# Patient Record
Sex: Female | Born: 1935 | Race: White | Hispanic: No | Smoking: Never smoker
Health system: Southern US, Community
[De-identification: ages and names within clinical notes are randomized; demographics above are authoritative.]

## PROBLEM LIST (undated history)

## (undated) DIAGNOSIS — M5481 Occipital neuralgia: Secondary | ICD-10-CM

## (undated) DIAGNOSIS — R51 Headache: Secondary | ICD-10-CM

## (undated) DIAGNOSIS — M1990 Unspecified osteoarthritis, unspecified site: Secondary | ICD-10-CM

## (undated) DIAGNOSIS — I10 Essential (primary) hypertension: Secondary | ICD-10-CM

## (undated) DIAGNOSIS — I4891 Unspecified atrial fibrillation: Secondary | ICD-10-CM

## (undated) DIAGNOSIS — I519 Heart disease, unspecified: Secondary | ICD-10-CM

## (undated) DIAGNOSIS — E785 Hyperlipidemia, unspecified: Secondary | ICD-10-CM

## (undated) DIAGNOSIS — M810 Age-related osteoporosis without current pathological fracture: Secondary | ICD-10-CM

## (undated) DIAGNOSIS — I5022 Chronic systolic (congestive) heart failure: Secondary | ICD-10-CM

## (undated) DIAGNOSIS — M40209 Unspecified kyphosis, site unspecified: Secondary | ICD-10-CM

## (undated) HISTORY — DX: Age-related osteoporosis without current pathological fracture: M81.0

## (undated) HISTORY — DX: Chronic systolic (congestive) heart failure: I50.22

## (undated) HISTORY — PX: CATARACT EXTRACTION: SUR2

## (undated) HISTORY — DX: Essential (primary) hypertension: I10

## (undated) HISTORY — DX: Occipital neuralgia: M54.81

## (undated) HISTORY — DX: Unspecified atrial fibrillation: I48.91

## (undated) HISTORY — DX: Unspecified kyphosis, site unspecified: M40.209

## (undated) HISTORY — DX: Unspecified osteoarthritis, unspecified site: M19.90

## (undated) HISTORY — DX: Heart disease, unspecified: I51.9

## (undated) HISTORY — DX: Hyperlipidemia, unspecified: E78.5

## (undated) HISTORY — DX: Headache: R51

---

## 1998-01-01 ENCOUNTER — Other Ambulatory Visit: Admission: RE | Admit: 1998-01-01 | Discharge: 1998-01-01 | Payer: Self-pay | Admitting: Internal Medicine

## 1999-02-01 ENCOUNTER — Other Ambulatory Visit: Admission: RE | Admit: 1999-02-01 | Discharge: 1999-02-01 | Payer: Self-pay | Admitting: Internal Medicine

## 1999-12-14 ENCOUNTER — Ambulatory Visit (HOSPITAL_COMMUNITY): Admission: RE | Admit: 1999-12-14 | Discharge: 1999-12-14 | Payer: Self-pay | Admitting: Specialist

## 2000-02-20 ENCOUNTER — Other Ambulatory Visit: Admission: RE | Admit: 2000-02-20 | Discharge: 2000-02-20 | Payer: Self-pay | Admitting: Internal Medicine

## 2000-03-07 ENCOUNTER — Ambulatory Visit (HOSPITAL_COMMUNITY): Admission: RE | Admit: 2000-03-07 | Discharge: 2000-03-07 | Payer: Self-pay | Admitting: Specialist

## 2000-04-18 ENCOUNTER — Ambulatory Visit (HOSPITAL_COMMUNITY): Admission: RE | Admit: 2000-04-18 | Discharge: 2000-04-18 | Payer: Self-pay | Admitting: *Deleted

## 2001-02-26 ENCOUNTER — Other Ambulatory Visit: Admission: RE | Admit: 2001-02-26 | Discharge: 2001-02-26 | Payer: Self-pay | Admitting: Internal Medicine

## 2003-02-04 ENCOUNTER — Encounter (INDEPENDENT_AMBULATORY_CARE_PROVIDER_SITE_OTHER): Payer: Self-pay

## 2003-02-04 ENCOUNTER — Ambulatory Visit (HOSPITAL_COMMUNITY): Admission: RE | Admit: 2003-02-04 | Discharge: 2003-02-04 | Payer: Self-pay | Admitting: *Deleted

## 2004-02-22 ENCOUNTER — Encounter (INDEPENDENT_AMBULATORY_CARE_PROVIDER_SITE_OTHER): Payer: Self-pay | Admitting: *Deleted

## 2004-02-22 ENCOUNTER — Ambulatory Visit (HOSPITAL_COMMUNITY): Admission: RE | Admit: 2004-02-22 | Discharge: 2004-02-22 | Payer: Self-pay | Admitting: *Deleted

## 2007-04-15 ENCOUNTER — Inpatient Hospital Stay (HOSPITAL_COMMUNITY): Admission: AD | Admit: 2007-04-15 | Discharge: 2007-04-18 | Payer: Self-pay | Admitting: *Deleted

## 2007-04-15 ENCOUNTER — Encounter: Payer: Self-pay | Admitting: *Deleted

## 2007-04-16 ENCOUNTER — Encounter (INDEPENDENT_AMBULATORY_CARE_PROVIDER_SITE_OTHER): Payer: Self-pay | Admitting: *Deleted

## 2007-04-28 ENCOUNTER — Encounter: Admission: RE | Admit: 2007-04-28 | Discharge: 2007-04-28 | Payer: Self-pay | Admitting: Orthopedic Surgery

## 2007-09-12 ENCOUNTER — Encounter: Admission: RE | Admit: 2007-09-12 | Discharge: 2007-09-12 | Payer: Self-pay | Admitting: Internal Medicine

## 2010-11-15 ENCOUNTER — Encounter: Payer: Self-pay | Admitting: Cardiology

## 2010-11-15 DIAGNOSIS — I519 Heart disease, unspecified: Secondary | ICD-10-CM | POA: Insufficient documentation

## 2010-11-15 DIAGNOSIS — E785 Hyperlipidemia, unspecified: Secondary | ICD-10-CM | POA: Insufficient documentation

## 2010-11-15 DIAGNOSIS — I4891 Unspecified atrial fibrillation: Secondary | ICD-10-CM | POA: Insufficient documentation

## 2010-11-15 DIAGNOSIS — M1990 Unspecified osteoarthritis, unspecified site: Secondary | ICD-10-CM | POA: Insufficient documentation

## 2010-11-15 DIAGNOSIS — I10 Essential (primary) hypertension: Secondary | ICD-10-CM | POA: Insufficient documentation

## 2010-11-22 ENCOUNTER — Encounter: Payer: Self-pay | Admitting: Cardiology

## 2010-11-22 ENCOUNTER — Ambulatory Visit (INDEPENDENT_AMBULATORY_CARE_PROVIDER_SITE_OTHER): Payer: Medicare Other | Admitting: Cardiology

## 2010-11-22 VITALS — BP 130/76 | HR 76 | Ht <= 58 in | Wt 107.1 lb

## 2010-11-22 DIAGNOSIS — I519 Heart disease, unspecified: Secondary | ICD-10-CM

## 2010-11-22 DIAGNOSIS — I10 Essential (primary) hypertension: Secondary | ICD-10-CM

## 2010-11-22 DIAGNOSIS — I4891 Unspecified atrial fibrillation: Secondary | ICD-10-CM

## 2010-11-22 NOTE — Assessment & Plan Note (Signed)
Well-controlled by my exam. We will continue with her current therapy and follow up again in one year.

## 2010-11-22 NOTE — Assessment & Plan Note (Signed)
Mild LV dysfunction by echocardiogram. She is asymptomatic. She is on appropriate therapy.

## 2010-11-22 NOTE — Patient Instructions (Signed)
Continue current medications.  Call us with any cardiac complaints.  I will see you back again in 1 year.

## 2010-11-22 NOTE — Progress Notes (Signed)
Alicia Gomez Date of Birth: 23-May-1926   History of Present Illness: Alicia Gomez is seen for yearly followup. She is done for a well this past year. She denies any complaints of shortness of breath, chest pain, or palpitations. She is basically on the same medications except her Lipitor was switched to Crestor.  Current Outpatient Prescriptions on File Prior to Visit  Medication Sig Dispense Refill  . acetaminophen (TYLENOL) 325 MG tablet Take 650 mg by mouth at bedtime.        Marland Kitchen alendronate (FOSAMAX) 70 MG tablet Take 70 mg by mouth every 7 (seven) days. Take with a full glass of water on an empty stomach.       Marland Kitchen aspirin 81 MG tablet Take 81 mg by mouth daily.        . bimatoprost (LUMIGAN) 0.03 % ophthalmic drops Apply 1 drop to eye at bedtime.        . carvedilol (COREG) 25 MG tablet Take 12.5 mg by mouth 2 (two) times daily with a meal.        . Cholecalciferol (VITAMIN D PO) Take by mouth daily.        . cyclobenzaprine (FLEXERIL) 10 MG tablet Take 10 mg by mouth 2 (two) times daily.        Marland Kitchen escitalopram (LEXAPRO) 10 MG tablet Take 10 mg by mouth daily.        Marland Kitchen gabapentin (NEURONTIN) 100 MG capsule Take 100 mg by mouth daily. 1-2 DAILY       . Multiple Vitamin (MULTIVITAMIN) tablet Take 1 tablet by mouth daily.        Marland Kitchen omeprazole (PRILOSEC OTC) 20 MG tablet Take 20 mg by mouth daily.        . rosuvastatin (CRESTOR) 10 MG tablet Take 10 mg by mouth at bedtime.        Marland Kitchen telmisartan (MICARDIS) 80 MG tablet Take 80 mg by mouth daily.        . timolol (BETIMOL) 0.5 % ophthalmic solution Apply 1 drop to eye 1 dose over 46 hours.        Marland Kitchen DISCONTD: atorvastatin (LIPITOR) 40 MG tablet Take 20 mg by mouth daily.        Marland Kitchen DISCONTD: telmisartan-hydrochlorothiazide (MICARDIS HCT) 40-12.5 MG per tablet Take 1 tablet by mouth daily.          Allergies  Allergen Reactions  . Codeine     Past Medical History  Diagnosis Date  . Left ventricular dysfunction     NOW NORMALIZED  .  Hypertension   . Dyslipidemia   . Osteoarthritis   . Atrial fibrillation     RESOLVED  . Kyphosis     Past Surgical History  Procedure Date  . Cataract extraction     History  Smoking status  . Never Smoker   Smokeless tobacco  . Not on file    History  Alcohol Use No    Family History  Problem Relation Age of Onset  . Heart disease Brother   . Hypertension Other     6 siblings died with heart disease    Review of Systems:   All other systems were reviewed and are negative.  Physical Exam: BP 130/76  Pulse 76  Ht 4' (1.219 m)  Wt 107 lb 2 oz (48.592 kg)  BMI 32.69 kg/m2 She is a pleasant elderly white female in no acute distress. Her HEENT exam is unremarkable. She has no JVD or bruits. Lungs  are clear. Cardiac exam reveals a regular rate and rhythm without gallop, murmur, or click. She has marked kyphosis. She has no edema. Pedal pulses are good. LABORATORY DATA: ECG demonstrates normal sinus rhythm with an interventricular conduction delay and left axis deviation.  Assessment / Plan:

## 2010-11-22 NOTE — Assessment & Plan Note (Signed)
Remote history. No recurrence. We'll continue with baby aspirin daily and beta blocker therapy.

## 2010-12-06 NOTE — Discharge Summary (Signed)
NAMEKEBRINA, FRIEND                 ACCOUNT NO.:  [id number]   MEDICAL RECORD NO.:  [id number]          PATIENT TYPE:  INP   LOCATION:  2011                         FACILITY:  [facility]   PHYSICIAN:  Elmore Guise., M.D.DATE OF BIRTH:  Oct 14, 1925   DATE OF ADMISSION:  04/15/2007  DATE OF DISCHARGE:  04/18/2007                               DISCHARGE SUMMARY   DISCHARGE DIAGNOSES:  1. New-onset atrial fibrillation now in normal sinus rhythm.  2. History of etiopathic dilated cardiomyopathy (EF approximately      25%).  3. Dyslipidemia.  4. Hypertension.  5. Osteoarthritis.   HISTORY OF PRESENT ILLNESS:  Ms. Alicia Gomez is a very pleasant 77 year old  white female who presented initially to [facility] ER for evaluation of  dizziness, palpitations and shortness of breath.  She was found to be in  atrial fibrillation with rapid ventricular response.   HOSPITAL COURSE:  Because of bed issues, she was transferred to [facility] so  that she could have telemetry monitoring.  Her hospital stay was  uncomplicated.  She converted to normal sinus rhythm after starting IV  amiodarone.  She now reports she feels much better.  She has been up and  ambulatory and tolerating normal diet without any problems.  She was  started on Coumadin during her hospital stay with slight increase in her  PT prior to discharge.  Her renal function remained stable as well as  her liver function throughout her hospital stay.  She did have PFTs and  a TSH done prior to starting her amiodarone.   DISCHARGE MEDICATIONS:  1. Amiodarone 200 mg two times daily.  2. Coumadin 2 mg one time daily.  3. Lipitor 40 mg half tablet daily.  4. Micardis HCT 40/12.5 mg one time daily.  5. Coreg 25 mg half tablet twice daily.  6. Lexapro 10 mg daily.  7. Fosamax as before.  8. Calcium as before.   FOLLOWUP:  Her follow-up appointment will be with Dr. Reyes Ivan at  [facility] on Monday September 29 for PT/INR, office visit  and ECG.  She is to call the office if she has any problems prior to  that time.  All her questions were answered prior to discharge.  Her  medications were reviewed with her at length      Elmore Guise., M.D.  Electronically Signed     TWK/MEDQ  D:  04/18/2007  T:  04/18/2007  Job:  [id number]   cc:   Massie Maroon, MD

## 2010-12-06 NOTE — H&P (Signed)
NAME:  Alicia Gomez, Alicia Gomez                 ACCOUNT NO.:  [id number]   MEDICAL RECORD NO.:  [id number]          PATIENT TYPE:  EMS   LOCATION:  ED                           FACILITY:  [facility]   PHYSICIAN:  Elmore Guise., M.D.DATE OF BIRTH:  12-20-1925   DATE OF ADMISSION:  04/15/2007  DATE OF DISCHARGE:                              HISTORY & PHYSICAL   INDICATION FOR ADMISSION:  Atrial fibrillation with rapid ventricular  response.   PRIMARY CARE PHYSICIAN:  Massie Maroon, M.D., [facility].   HISTORY OF PRESENT ILLNESS:  Alicia Gomez is a very pleasant 77 year old  white female with past medical history of CHF, hypertension,  osteoarthritis who presents for evaluation of atrial fibrillation with  rapid ventricular response.  The patient actually reports a 1-week  history of difficulty with her blood pressure and mild dyspnea.  She  states that she has actually been increasing her Micardis dose to help  her get her blood pressure under better control.  On Saturday, she had a  fleeting spell of palpitations with her heart racing and skipping.  This  lasted only for a couple minutes and then went back to normal.  On  Sunday right before church, she had a second spell with her heart  racing, skipping and pounding.  She felt a little lightheaded and short  of breath; however, her spell stopped and she was able to go about her  normal activities without any problems.  Today, she awoke with a heart  rate skipping and pounding.  She got a little short- winded, felt dizzy.  She went to [facility] to see Dr. Selena Batten.  There, she was found to  be in atrial fibrillation with rapid ventricular response.  She was then  sent to the [facility] emergency room for further evaluation.  On arrival  here, the patient was hemodynamically stable but in atrial fibrillation  with rapid ventricular response and a heart rate in the 120-150 range.  She was given Cardizem 10 mg IV bolus x1 and started on a  Cardizem drip  with improvement in her heart rate.  She is currently without complaint.  She does report off and on right knee pain and has had a cortisone  injection in the past.  She has also reported that she has had to  increase her ibuprofen use over the last week because of her knee pain.  She denies any orthopnea or PND.  No presyncope or syncope.  No recent  fever or cough, and no lower extremity edema.   REVIEW OF SYSTEMS:  As per HPI.  All others are negative.   CURRENT MEDICATIONS:  1. Coreg 25 mg twice daily.  2. Micardis 80 mg daily.  3. Ibuprofen p.r.n.  4. Calcium.  5. Fosamax.   ALLERGIES:  CODEINE.   FAMILY HISTORY:  Positive for diabetes and heart disease.   SOCIAL HISTORY:  She lives independently.  She is very active.  She  still mows her yard.  No tobacco or alcohol use.   PHYSICAL EXAMINATION:  VITAL SIGNS:  She is afebrile.  Temperature is  97.8, blood pressure 120/85, heart rate is 125, showing atrial  fibrillation on the monitor.  She is saturating 99% on room air.  GENERAL:  She is a very pleasant, elderly white female alert and  oriented x4 and in no acute distress.  HEENT:  Normal.  NECK:  Supple.  No lymphadenopathy, 2+ carotids.  No JVD and no bruits.  LUNGS:  Clear.  HEART:  Irregularly irregular with 2/6 systolic ejection murmur noted.  ABDOMEN:  Soft, nontender, nondistended.  No rebound or guarding.  EXTREMITIES:  Warm with 2+ pulses and no significant edema.  NEUROLOGIC:  Cranial nerves are intact.   Carotid sinus massage was performed which showed a heart rate decreasing  down to less than 100.   LABORATORY DATA:  White count 12.1, hemoglobin of 13.4, platelet count  401.  She had a BUN of 25 and creatinine of 0.9, potassium level 4.0.  Her LFTs were normal.   Her chest x-ray shows no acute cardiopulmonary disease, and her ECG  shows atrial fibrillation with rapid ventricular response with heart  rate in the 150s with anterior Q waves  noted.  Cannot rule out  anteroseptal MI.  She had nonspecific ST-T wave changes noted.   IMPRESSION:  1. New-onset atrial fibrillation with rapid ventricular response.  2. Hypertension.  3. History of congestive heart failure per patient.   PLAN:  1. The patient will be admitted to telemetry monitoring.  We will      continue Cardizem drip for rate control.  I will also continue her      Coreg and Micardis.  She will have an echocardiogram done to      evaluate for any valvular or systolic heart disease.  We will also      check TSH and serial cardiac enzymes.  For right now, she will be      treated with Lovenox and aspirin.  All of her questions were      answered.  I will also try to obtain some of her previous workup      from Dr. Elmyra Ricks office.      Elmore Guise., M.D.  Electronically Signed     TWK/MEDQ  D:  04/15/2007  T:  04/16/2007  Job:  [id number]   cc:   Massie Maroon, MD  Fax: [phone]

## 2010-12-08 ENCOUNTER — Emergency Department (HOSPITAL_COMMUNITY): Payer: Medicare Other

## 2010-12-08 ENCOUNTER — Observation Stay (HOSPITAL_COMMUNITY)
Admission: EM | Admit: 2010-12-08 | Discharge: 2010-12-10 | Disposition: A | Discharge status: Home / Self Care | Payer: Medicare Other | Source: Ambulatory Visit | Attending: Internal Medicine | Admitting: Internal Medicine

## 2010-12-08 DIAGNOSIS — R42 Dizziness and giddiness: Secondary | ICD-10-CM | POA: Insufficient documentation

## 2010-12-08 DIAGNOSIS — I671 Cerebral aneurysm, nonruptured: Secondary | ICD-10-CM | POA: Insufficient documentation

## 2010-12-08 DIAGNOSIS — Z7902 Long term (current) use of antithrombotics/antiplatelets: Secondary | ICD-10-CM | POA: Insufficient documentation

## 2010-12-08 DIAGNOSIS — H269 Unspecified cataract: Secondary | ICD-10-CM | POA: Insufficient documentation

## 2010-12-08 DIAGNOSIS — K219 Gastro-esophageal reflux disease without esophagitis: Secondary | ICD-10-CM | POA: Insufficient documentation

## 2010-12-08 DIAGNOSIS — R279 Unspecified lack of coordination: Secondary | ICD-10-CM | POA: Insufficient documentation

## 2010-12-08 DIAGNOSIS — E785 Hyperlipidemia, unspecified: Secondary | ICD-10-CM | POA: Insufficient documentation

## 2010-12-08 DIAGNOSIS — E860 Dehydration: Secondary | ICD-10-CM | POA: Insufficient documentation

## 2010-12-08 DIAGNOSIS — R5381 Other malaise: Principal | ICD-10-CM | POA: Insufficient documentation

## 2010-12-08 DIAGNOSIS — Z79899 Other long term (current) drug therapy: Secondary | ICD-10-CM | POA: Insufficient documentation

## 2010-12-08 DIAGNOSIS — G589 Mononeuropathy, unspecified: Secondary | ICD-10-CM | POA: Insufficient documentation

## 2010-12-08 DIAGNOSIS — I10 Essential (primary) hypertension: Secondary | ICD-10-CM | POA: Insufficient documentation

## 2010-12-08 DIAGNOSIS — M899 Disorder of bone, unspecified: Secondary | ICD-10-CM | POA: Insufficient documentation

## 2010-12-08 LAB — URINALYSIS, ROUTINE W REFLEX MICROSCOPIC
Glucose, UA: NEGATIVE mg/dL
Hgb urine dipstick: NEGATIVE
Ketones, ur: 15 mg/dL — AB
Protein, ur: NEGATIVE mg/dL
Specific Gravity, Urine: 1.017 (ref 1.005–1.030)
pH: 6 (ref 5.0–8.0)

## 2010-12-08 LAB — CBC
Hemoglobin: 11.6 g/dL — ABNORMAL LOW (ref 12.0–15.0)
MCHC: 33.7 g/dL (ref 30.0–36.0)
WBC: 6.8 10*3/uL (ref 4.0–10.5)

## 2010-12-08 LAB — POCT CARDIAC MARKERS: Myoglobin, poc: 492 ng/mL (ref 12–200)

## 2010-12-08 LAB — DIFFERENTIAL
Basophils Relative: 0 % (ref 0–1)
Eosinophils Relative: 1 % (ref 0–5)
Lymphocytes Relative: 13 % (ref 12–46)
Lymphs Abs: 0.9 10*3/uL (ref 0.7–4.0)
Monocytes Relative: 2 % — ABNORMAL LOW (ref 3–12)
Neutro Abs: 5.8 10*3/uL (ref 1.7–7.7)

## 2010-12-08 LAB — COMPREHENSIVE METABOLIC PANEL
AST: 21 U/L (ref 0–37)
Albumin: 3.9 g/dL (ref 3.5–5.2)
CO2: 28 mEq/L (ref 19–32)
GFR calc non Af Amer: >60 mL/min (ref >60)
Glucose, Bld: 137 mg/dL — ABNORMAL HIGH (ref 70–99)
Total Bilirubin: 0.4 mg/dL (ref 0.3–1.2)
Total Protein: 6.9 g/dL (ref 6.0–8.3)

## 2010-12-08 LAB — GLUCOSE, CAPILLARY: Glucose-Capillary: 117 mg/dL — ABNORMAL HIGH (ref 70–99)

## 2010-12-08 MED ORDER — IOHEXOL 350 MG/ML SOLN
50.0000 mL | Freq: Once | INTRAVENOUS | Status: AC | PRN
  Administered 2010-12-08: 50 mL via INTRAVENOUS

## 2010-12-09 ENCOUNTER — Observation Stay (HOSPITAL_COMMUNITY): Payer: Medicare Other

## 2010-12-09 NOTE — Op Note (Signed)
[place]. [facility]  Patient:    Alicia Gomez, Alicia Gomez                        MRN: [id number] Proc. Date: 12/14/99 Adm. Date:  [id number] Disc. Date: [id number] Attending:  Mick Sell                           Operative Report  PREOPERATIVE DIAGNOSIS:  Cataract, left eye.  POSTOPERATIVE DIAGNOSIS:  Cataract, left eye.  OPERATION PERFORMED:  Cataract extraction with intraocular lens implant, OS.  SURGEON:  Chucky May, M.D.  ANESTHESIA:  INDICATIONS FOR PROCEDURE:  The patient is a 77 year old female with painless progressive decrease in vision so she has difficulty seeing for reading.  On examination, she was found to have a dense nuclear sclerotic and cortical cataract consistent with a decrease in visual acuity.  DESCRIPTION OF PROCEDURE:  The patient was brought to the main operating room and placed in the supine position.  Anesthesia was obtained by means of topical 4% lidocaine drops with tetracaine.  She was then prepped and draped in the usual manner.  A lid speculum was inserted and the cornea was entered with a diamond keratome superiorly with an additional port superior temporally.  Occucoat was instilled and an anterior capsulorrhexis was performed without difficulty.  The nucleus was mobilized by hydrodissection followed by phacoemulsification of the nucleus and removal of residual cortical material by irrigation and aspiration.  The posterior capsule was polished and the posterior chamber lens implant was placed in the bag without difficulty.  Occucoat was removed and replaced with balanced salt solution. The wound was hydrated with balanced salt solution, checked for fluid leaks and none were noted.  The eye was dressed with topical Pred-Forte, Ocuflox, Voltaren, and a Fox shield.  The patient was taken to the recovery room in excellent condition where she received written and verbal instructions for postoperative care, and  was scheduled for a follow up in 24 hours. DD:  01/18/00 TD:  01/19/00 Job: [id number]

## 2010-12-09 NOTE — Op Note (Signed)
   NAME:  Alicia Gomez, Alicia Gomez                           ACCOUNT NO.:  [id number]   MEDICAL RECORD NO.:  [id number]                   PATIENT TYPE:  AMB   LOCATION:  ENDO                                 [facility]ACILITY:  [facility]   PHYSICIAN:  Georgiana Spinner, M.D.                 DATE OF BIRTH:  [birth year]   DATE OF PROCEDURE:  02/04/2003  DATE OF DISCHARGE:                                 OPERATIVE REPORT   PROCEDURE:  Colonoscopy.   INDICATIONS:  Colon polyps.   ANESTHESIA:  1. Demerol 10 mg.  2. Versed 1 mg.   DESCRIPTION OF PROCEDURE:  With the patient mildly sedated in the left  lateral decubitus position, the Olympus videoscopic variable-stiffness  colonoscope was inserted in the rectum and passed under direct vision  through a tortuous diverticula-filled sigmoid colon to the cecum, identified  by the ileocecal valve and appendiceal orifice, both of which were  photographed.  From this point, the colonoscope was slowly withdrawn, taking  circumferential views of the colonic mucosa, stopping only to photograph  diverticula seen along the way in the sigmoid colon until we reached the  rectum which appeared normal on direct and retroflexed view.  The endoscope  was straightened and withdrawn.  The patient's vital signs and pulse  oximeter remained stable.  The patient tolerated the procedure well without  apparent complications.   FINDINGS:  1. Significant diverticulosis of the sigmoid colon.  2. Otherwise, an unremarkable exam.   PLAN:  Repeat examination possibly in five years.  See endoscopy note for  further details.                                               Georgiana Spinner, M.D.    GMO/MEDQ  D:  02/04/2003  T:  02/04/2003  Job:  [id number]

## 2010-12-09 NOTE — Op Note (Signed)
[place]. [facility]  Patient:    Alicia Gomez, Alicia Gomez                        MRN: [id number] Proc. Date: 03/05/00 Adm. Date:  [id number] Disc. Date: [id number] Attending:  Mick Sell                           Operative Report  PREOPERATIVE DIAGNOSIS:  Cataract, OD  POSTOPERATIVE DIAGNOSIS:  Cataract, OD  OPERATION PERFORMED:  Cataract extraction with intraocular lens implant, OD.  INDICATIONS:  The patient is a 77 year old female with painless progressive decrease in vision associated with difficulty seeing for reading.  On examination, she was found to have a dense nucleosclerotic and cortical cataract consistent with decrease in visual acuity.  PROCEDURE IN DETAIL:  The patient was brought to the main operating room, placed in supine position.  Anesthesia was obtained by means of topical 4% lidocaine drops with tetracaine.  She was then prepped and draped in the usual manner.  A lid speculum was inserted and the cornea was entered with a diamond keratome superiorly with an additional port superotemorally.  OcuCoat was instilled and an anterior capsulorrhexis was performed.  The nucleus was mobilized, hydrodissected and phacoemulsified, with residual cortical material removed by irrigation and aspiration.  The posterior capsule was polished and a posterior chamber lens implant was placed in the bag without difficulty. OcuCoat was removed and replaced with balanced salt solution. The wound was hydrated with balanced salt solution and checked for fluid leaks and none were noted.  The eye was dressed with topical Pred Forte, Ocuflox, Voltaren, and a Fox shield and the patient was taken to the recovery room in excellent condition, where she received written and verbal instructions and was scheduled for followup in 24 hours. DD:  04/05/00 TD:  04/07/00 Job: [id number]

## 2010-12-09 NOTE — Procedures (Signed)
[place]. [facility]  Patient:    Alicia Gomez, Alicia Gomez                        MRN: [id number] Proc. Date: 04/18/00 Adm. Date:  [id number] Attending:  Sabino Gasser                           Procedure Report  PROCEDURE:  Colonoscopy.  INDICATIONS:  Hemoccult positivity.  ANESTHESIA:  Demerol 25 mg, Versed 2 mg.  PROCEDURE:  With the patient mildly sedated and in the left lateral decubitus position, the Olympus videoscopic variable-stiffness colonoscope was inserted in the rectum and passed under direct vision to the cecum.  Cecum identified by the ileocecal valve and appendiceal orifice, both of which were photographed.  From this point, the colonoscope was slowly withdrawn, taking circumferential views of the entire colonic mucosa, stopping only in the sigmoid colon, where significant diverticulosis was seen and a polyp was seen at approximately 20 cm.  This was photographed and using snare cautery technique, setting of 20/20 blended current, it was removed.  The endoscope was then withdrawn to the rectum, which appeared normal on direct view and showed essentially normal on retroflexed view.  The endoscope was straightened and withdrawn.  Patients vital signs and pulse oximeter remained stable.  The patient tolerated the procedure well without apparent complications.  FINDINGS:  Significant diverticulosis involving all of the sigmoid colon and a good part of the left colon.  Additionally, polyp at 20 cm.  Await biopsy report.  Patient will call me for results and follow up with me as an outpatient. DD:  04/18/00 TD:  04/18/00 Job: [id number]

## 2010-12-09 NOTE — Op Note (Signed)
NAME:  Alicia Gomez, Alicia Gomez                           ACCOUNT NO.:  [id number]   MEDICAL RECORD NO.:  [id number]                   PATIENT TYPE:  AMB   LOCATION:  ENDO                                 [facility]ACILITY[facility]  [facility]   PHYSICIAN:  Georgiana Spinner, M.D.                 DATE OF BIRTH:  September 12, 1925   DATE OF PROCEDURE:  02/22/2004  DATE OF DISCHARGE:                                 OPERATIVE REPORT   PROCEDURE:  Upper endoscopy.   INDICATIONS:  GERD.   ANESTHESIA:  Demerol 20 mg, Versed 3 mg.   PROCEDURE:  With the patient mildly sedated in the left lateral decubitus  position, the Olympus videoscopic endoscope was inserted in the mouth,  passed under direct vision through the esophagus, which was photographed.  In the distal area there was a question of a questionable flame of  Barrett's, photographed.  We entered into the stomach.  Fundus, body,  antrum, duodenal bulb, second portion of the duodenum were visualized.  All  appeared normal.  From this point the endoscope was slowly withdrawn, taking  circumferential views of the duodenal mucosa until the endoscope had been  pulled back into the stomach, placed in retroflexion to view the stomach  from below, and a large hiatal hernia was seen and photographed.  The  endoscope was then straightened and withdrawn taking circumferential views  of the remaining gastric and esophageal mucosa, stopping in the distal  esophagus to biopsy this questionable flame of Barrett's.  The patient's  vital signs and pulse oximetry remained stable.  The patient tolerated the  procedure well without apparent complication.   FINDINGS:  Question of Barrett's esophagus, biopsied.   Await biopsy report.  The patient will call me for results and follow up  with me as an outpatient.                                               Georgiana Spinner, M.D.    GMO/MEDQ  D:  02/22/2004  T:  02/22/2004  Job:  [id number]

## 2010-12-09 NOTE — Procedures (Signed)
[place]. [facility]  Patient:    Alicia Gomez, Alicia Gomez                        MRN: [id number] Proc. Date: 04/18/00 Adm. Date:  [id number] Attending:  Sabino Gasser                           Procedure Report  PROCEDURE:  Upper endoscopy with biopsy.  INDICATIONS:  Hemoccult positivity.  ANESTHESIA:  Demerol 50 mg, Versed 3 mg given intravenously in divided dose.  PROCEDURE:  With the patient mildly sedated in the left lateral decubitus position, the Olympus videoscopic endoscope was inserted in the mouth and passed under direct vision through the esophagus.  Distal esophagus was approached and showed some changes of esophagitis and possible stricture above a hiatal hernia photographed and biopsied.  We entered into the stomach. Fundus, body and antrum were well-visualized.  In the fundus, there were ulcerations, linear excoriations that were photographed and biopsied.  These may be NSAID-induced.  We will await biopsy.  We entered into the duodenal bulb and second portion of duodenum, both of which appeared normal and were photographed.  From this point, the endoscope was slowly withdrawn, taking circumferential views of the entire duodenal mucosa until the endoscope had been pulled back into the stomach, placed on retroflexion to view the stomach from below and this showed the large hiatal hernia.  The endoscope was straightened and pulled back from distal to proximal stomach taking circumferential views of the entire gastric and subsequently the esophageal mucosa, which appeared, other than above, normal.  Patients vital signs and pulse oximeter remained stable.  The patient tolerated the procedure well without apparent complications.  FINDINGS:  Ulcerations throughout fundus of stomach, possibly NSAID-related. Await biopsy.  Erythema of distal esophagus with possible stricture formation above a hiatal hernia, again await biopsy.  Patient will call me for  results and follow up with me as an outpatient.  Proceed to colonoscopy. DD:  04/18/00 TD:  04/18/00 Job: [id number]

## 2010-12-09 NOTE — Op Note (Signed)
   NAME:  Alicia Gomez, Alicia Gomez                           ACCOUNT NO.:  [id number]   MEDICAL RECORD NO.:  [id number]                   PATIENT TYPE:  AMB   LOCATION:  ENDO                                 FACILITY:  [facility]   PHYSICIAN:  Georgiana Spinner, M.D.                 DATE OF BIRTH:  [birth year]   DATE OF PROCEDURE:  02/04/2003  DATE OF DISCHARGE:                                 OPERATIVE REPORT   PROCEDURE:  Upper endoscopy.   INDICATIONS:  GERD with history of Barrett's esophagus.   ANESTHESIA:  Demerol 40 mg, Versed 3 mg.   PROCEDURE:  With the patient mildly sedated in the left lateral decubitus  position, the Olympus videoscopic endoscope was inserted in the mouth,  passed under direct vision through the esophagus, which appeared grossly  normal.  We entered into the stomach through a very large hiatal hernia.  Fundus, body, antrum, duodenal bulb, second portion of the duodenum appeared  normal.  From this point the endoscope was slowly withdrawn, taking  circumferential views of the duodenal mucosa until the endoscope was pulled  back into the stomach, placed in retroflexion to view the stomach from  below, and once again the large hiatal hernia was photographed.  The  endoscope was straightened and withdrawn, taking circumferential views of  the remaining gastric and esophageal mucosa, stopping in the distal  esophagus, where there was a change possibly of Barrett's esophagus, where a  pseudodiverticulum was noted.  Biopsies were taken.  The patient's vital  signs and pulse oximetry remained stable.  The patient tolerated the  procedure well and without apparent complications.   FINDINGS:  1. Large hiatal hernia.  2. Question Barrett's esophagus, biopsied.   Await biopsy report.  Have the patient call me for results and follow up  with me as an outpatient.  Proceed to colonoscopy as planned.                                                Georgiana Spinner, M.D.    GMO/MEDQ   D:  02/04/2003  T:  02/04/2003  Job:  [id number]

## 2010-12-22 NOTE — Discharge Summary (Signed)
NAME:  Alicia Gomez, Alicia Gomez                 ACCOUNT NO.:  [id number]  MEDICAL RECORD NO.:  [id number]           PATIENT TYPE:  O  LOCATION:  3027                         FACILITY:  [facility]  PHYSICIAN:  Altha Harm, MDDATE OF BIRTH:  1925-10-24  DATE OF ADMISSION:  12/08/2010 DATE OF DISCHARGE:  12/10/2010                              DISCHARGE SUMMARY   DISCHARGE DISPOSITION:  Home with around-the-clock supervision.  FINAL DISCHARGE DIAGNOSES: 1. Generalized weakness improved. 2. Mild dehydration. 3. Chronic intermittent dizziness baseline. 4. Cerebral aneurysm.  SECONDARY DIAGNOSES: 1. Hypertension. 2. Hyperlipidemia. 3. Osteoporosis. 4. Cataracts. 5. Gastroesophageal reflux disease. 6. Hyperlipidemia. 7. Intermittent spasms and neuropathy.  FINAL DISCHARGE MEDICATIONS: 1. Meclizine 12.5 mg p.o. q.8 h. 2. Alendronate 70 mg p.o. weekly on Tuesday. 3. Aspirin 81 mg p.o. daily. 4. Carvedilol 12.5 mg p.o. b.i.d. 5. Crestor 10 mg p.o. at bedtime. 6. Flexeril 10 mg p.o. b.i.d. 7. Gabapentin 100 mg p.o. b.i.d. p.r.n. pain. 8. Lexapro 10 mg p.o. daily. 9. Lumigan 1 drop to both eyes daily at bedtime. 10.Micardis 40 mg p.o. daily. 11.Prilosec over-the-counter 1 capsule p.o. daily. 12.Timolol ophthalmic solution 1 drop to each eye every morning. 13.Vitamin C over-the-counter 1 tablet p.o. daily.  CONSULTS:  Verbal consultation with Dr. Hoy Morn.  PROCEDURES:  None.  DIAGNOSTIC STUDIES: 1. CT of the head without contrast which shows a possible anterior     communicating aneurysm.  Intracranial CT will be obtained for     further evaluation. 2. No acute intracranial abnormality. 3. CT angiogram which shows anterior communicating artery aneurysm     bilobed.  Focal narrowing of the proximal left P1 segments and     proximal left superior cerebellar artery.  PRIMARY CARE PHYSICIAN:  Massie Maroon, MD  CODE STATUS:  Do not resuscitate.  ALLERGIES:  CODEINE.  CHIEF  COMPLAINT:  Weakness.  HISTORY OF PRESENT ILLNESS:  Please refer to the H&P for details of the history of present illness, however, in short this is a 77 year old elderly female who is frail at baseline has chronic intermittent dizziness and insists on being independent at community level albeit on safely.  The patient apparently had a very exhausting day prior to presentation and was unable to get up in the morning.  The patient had very little oral intake and had a very dry throat.  She presented to emergency room with complaint of weakness in addition to inability to speak.  HOSPITAL COURSE: 1. Generalized weakness.  The patient upon my evaluation in the     emergency room had no focal deficits.  It was cleared clear that     this is a patient who has progressed in her physical and     physiological senility and probably had over done it too much.  The     patient does live by herself and there were some questions to     whether or not this patient would be able to return to her home     setting.  I had a long discussion with the patient's sister-in-law     and both of whom are  her next of kin and healthcare power of     attorney.  The patient herself did not want to undergo any invasive     testing.  She showed no focality of symptoms and did not want to     undergo an MRI with which I agreed.  The patient was evaluated by     physical therapy and occupational therapy for functional safety.     The recommendation is that the patient has home health physical     therapy and occupational therapy and that she have a rolling walker     with 5-inch wheels.  The patient has been resistant to using a     walker.  She has been resistant to stopping driving and has been     resistant to a lifeline.  During this hospitalization, the patient     was counseled on the importance of a lifeline and to restrain from     any further driving as she is at risk felt to herself and others.     The  patient recovered her voice after hydrating herself.  She was     seen by physical therapy and occupational therapy.  There is a     belief that the patient may have some labyrinthine inner ear     dysfunction causing her to have a dizziness, however, given the     patient's anatomy in her neck, she is a poor candidate for a Dix-     Hallpike maneuver.  The patient had no orthostasis and has been     given some meclizine to help with dizziness.  At baseline the     patient has compromised sitting balance and does again she is     unsafe to perform any activities where she has to maintain strength     ability by using peripheral muscles.  I have discussed this at     length with the patient, her nephew, and her sister-in-law.  The     patient is going to go home with home health services and she will     have around-the-clock care.  Otherwise the patient has no acute     issues.  At the time of discharge, the patient is stable.  DISCHARGE PHYSICAL EXAMINATION:  VITAL SIGNS:  Temperature is 97.6, heart rate 75, blood pressure 158/85, respiratory rate 12, O2 sats are 96% on room air.  Her orthostatics were negative. HEENT:  She is normocephalic, atraumatic.  Pupils are equally round and reactive to light and accommodation.  Extraocular movements are intact. Oropharynx is moist.  No exudates, erythema, or lesions are noted. NECK:  Trachea is midline.  The patient does have a decreased cervical curve in the cervical spine.  She has no masses, no thyromegaly, no JVD, no carotid bruit. RESPIRATORY:  The patient has markedly kyphotic posture.  She has reversed PA and lateral excursion due to her anatomy.  However, she has no accessory muscle use.  There is no wheezing, rhonchi, or rales noted. CARDIOVASCULAR:  She has got a normal S1 and S2.  She has got a mild intermittent may be 1/6 systolic ejection murmur which is consistent with likely sclerosis.  It is nonradiating and coming from the  base. ABDOMEN:  Obese, soft, nontender, nondistended.  No masses, no hepatosplenomegaly is noted. EXTREMITIES:  No clubbing, cyanosis, or edema. PSYCHIATRIC:  She is alert and oriented x3.  Good insight and cognition. Good recent and remote recall. NEUROLOGICAL:  The patient shows no focal neurological deficit.  She does have generalized weakness and decreased balance at baseline and she is at her baseline at this time.  DIETARY RESTRICTIONS:  This patient does not eat very much and thus I would not restrict her diet on any level, allow her to eat anything that she wants to that enhances her caloric intake.  PHYSICAL RESTRICTIONS:  The patient should be using a rolling walker under the care of physical therapy.  Total time for this discharge process including face-to-face time is 35 minutes.     Altha Harm, MD     MAM/MEDQ  D:  12/10/2010  T:  12/10/2010  Job:  [id number]  cc:   Massie Maroon, MD  Electronically Signed by Marthann Schiller MD on 12/22/2010 09:27:49 AM

## 2010-12-22 NOTE — H&P (Signed)
NAME:  Alicia, Gomez                 ACCOUNT NO.:  [id number]  MEDICAL RECORD NO.:  [id number]           PATIENT TYPE:  O  LOCATION:  3027                         FACILITY:  [facility]  PHYSICIAN:  Altha Harm, MDDATE OF BIRTH:  06-01-1926  DATE OF ADMISSION:  12/08/2010 DATE OF DISCHARGE:                             HISTORY & PHYSICAL   CHIEF COMPLAINT:  Weakness.  HISTORY OF PRESENT ILLNESS:  Ms. Alicia Gomez is an 77 year old elderly female who awoke this morning feeling weak.  The patient's nephew reports that she had a hair appointment that she was scheduled to go to.  When she did not arrive, they called the nephew who went to her home and found her in bed.  The patient states that she felt weak.  She never lost consciousness.  She never had any falls or anything like that. Apparently, this patient inappropriately still driving and using a riding mower.  She has at baseline very poor balance and ambulates in a full trunk flexion position.  The patient states that on the day before her onset of symptoms, she has been pretty active, cocaine and being out on her riding mower.  The patient states that she has not had any fluids or anything to drink all day.  She denies any fever or chills.  She denies any nausea, vomiting or diarrhea.  The only other symptoms that she admits to is some dizziness, which apparently is not a new symptom and has been occurring intermittently for more than 2 years.  The patient upon presentation to the hospital was very inaudible and had a very low voice almost as though she has some vocal cord paralysis. Thus the ER physician became concerned who felt the patient needed to be admitted.  Upon my arrival to see the patient, I could hear some audible vocal production.  I have been giving the patient something to drink, her voice improved considerably and the patient became overtly audible to everyone in the room.  PAST MEDICAL HISTORY:  Significant  for; 1. Hypertension. 2. Hyperlipidemia. 3. Osteoporosis.  FAMILY HISTORY:  The patient denies any chronic illnesses in her family. She is unmarried and has no children.  SOCIAL HISTORY:  The patient lives alone.  She is, as noted above, usually independent of community level, which likely places the patient at risk to life and limb.  There is no tobacco or alcohol use.  She has her sister-in-law who is at the bedside and her nephew.  The sister-in- law believes that she may be the healthcare power of attorney, but we will check her papers today.  CURRENT MEDICATIONS:  Include the following; 1. Alendronate 70 mg p.o. weekly. 2. Aspirin 81 mg p.o. daily. 3. Coreg 12.5 mg p.o. b.i.d. 4. Crestor 10 mg p.o. at bedtime. 5. Cyclobenzaprine 10 mg p.o. b.i.d. 6. Gabapentin 100 mg p.o. b.i.d. p.r.n. 7. Lexapro 10 mg p.o. daily. 8. Lumigan eye drops 1 drop to both eyes at bedtime. 9. Micardis 40 mg p.o. daily. 10.Prilosec over-the-counter 1 tablet p.o. daily. 11.Timolol ophthalmic solution 1 drop to both eyes daily. 12.Vitamin B over-the-counter 1 cap  p.o. daily.  ALLERGIES:  CODEINE.  PRIMARY CARE PHYSICIAN:  Dr. Pearson Grippe.  REVIEW OF SYSTEMS:  All other systems negative.  DIAGNOSTIC STUDIES:  In the emergency room, she had a 12-lead EKG which shows an old left bundle branch block and otherwise sinus rhythm. Hemogram shows a white blood cell count of 6.8, hemoglobin of 11.6, hematocrit of 34.4, platelet count of 248.  Electrolytes; sodium 137, potassium 4.0, chloride 99, bicarb 28, BUN 19, creatinine 0.64.  CT of the head shows an anterior communicating aneurysm.  CT angiogram confirms the bilobar aneurysm in the anterior communicating artery and there is a question of focal narrowing at the proximal left P1 segment to the proximal left superior cerebellar artery.  PHYSICAL EXAMINATION:  GENERAL:  This is an elderly, frail-appearing lady who is lying in bed in no acute  distress. VITAL SIGNS:  Temperature is 97.4, heart rate 63, blood pressure 147/67, respiratory rate 18 and her O2 saturations are 98% on room air. HEENT:  She is normocephalic, atraumatic.  Pupils are equally round and reactive to light.  Oropharynx is moist.  No exudate, erythema or lesions are noted. NECK:  The patient has a reverse cervical curve.  Her trachea is midline.  There is no masses, no thyromegaly, no JVD or carotid bruit. RESPIRATORY:  The patient has a markedly kyphotic posture.  She has reversed excursion with AP greater than lateral excursion.  There is no wheezing or rhonchi noted.  She has got no visible accessory muscle use. CARDIOVASCULAR:  She has got a normal S1 and S2.  No murmurs, rubs or gallops noted.  PMI is nondisplaced.  No heaves or thrills on palpation. ABDOMEN:  Scaphoid, soft, nontender, nondistended.  No masses.  No hepatosplenomegaly is noted. LYMPH NODE:  She has got no cervical, axillary, or inguinal lymphadenopathy noted. NEUROLOGIC:  The patient appears to have no focal neurological deficits. Cranial nerves II through XII are grossly intact.  DTRs are 2+ in the bilateral upper and lower extremities.  Strength is about 3+ to 4- out of 5 in bilateral upper and lower extremities.  The patient requires assistance with transitional moving and her sitting balance is moderate at the best.  Many challenges causes the patient to be off balance which the family reports is a baseline for this patient. PSYCHIATRIC:  She is alert and oriented x3 with good cognition, questionable insight.  ASSESSMENT AND PLAN:  This is a patient who presents with generalized weakness.  I suspect that this patient is having no new inciting events causes her to have weakness; however, I believe that this is just progression of her senile issues.  I suspect that the patient has exerted herself and is taking longer to recover.  It is possible that the patient may have sustained a  small CVA.  I have spoken at length with the family about further testing and they do not want to pursue an MRI.  They essentially wanted the patient to be evaluated for her function and determinations made about what would provide her with the safest environment.  We will bring the patient in an observation basis and observe her for any decline over the next 12 to 24 hours.  I will ask Physical Therapy and Occupational Therapy to see the patient for evaluation.  Monitor blood pressures to make sure that she is maintaining her blood pressures and likely getting nutrition consult to ensure that she is eating as well as possible.  This patient certainly with  her balance issues poses a risk to herself and other people if she is driving.  She will likely require assisted devices to ambulate; however, her family reports that she is very resistant to using any assistive device.  I have already apprised the family that she will likely require around the clock here or at least a lifeline to which she has been resistant in the past.  Her home medications will be resume and the patient will be observed.  She will receive DVT prophylaxis such as Lovenox and further decisions about care will be based on initial response to therapy and clinical course.     Altha Harm, MD     MAM/MEDQ  D:  12/09/2010  T:  12/09/2010  Job:  [id number]  cc:   Massie Maroon, MD  Electronically Signed by Marthann Schiller MD on 12/22/2010 09:27:39 AM

## 2011-05-04 LAB — BASIC METABOLIC PANEL
Calcium: 9.3
Chloride: 103
Creatinine, Ser: 0.94
GFR calc Af Amer: 58 — ABNORMAL LOW
GFR calc Af Amer: >60
GFR calc non Af Amer: 48 — ABNORMAL LOW
Sodium: 135

## 2011-05-04 LAB — APTT
aPTT: 31
aPTT: 34
aPTT: 23 — ABNORMAL LOW

## 2011-05-04 LAB — COMPREHENSIVE METABOLIC PANEL
ALT: 16
Alkaline Phosphatase: 28 — ABNORMAL LOW
BUN: 25 — ABNORMAL HIGH
Calcium: 9.6
Creatinine, Ser: 0.97
Glucose, Bld: 111 — ABNORMAL HIGH
Glucose, Bld: 126 — ABNORMAL HIGH
Potassium: 4.6
Sodium: 138
Sodium: 143
Total Protein: 5.8 — ABNORMAL LOW
Total Protein: 6.5

## 2011-05-04 LAB — PROTIME-INR
INR: 1
INR: 1.1
Prothrombin Time: 14.6
Prothrombin Time: 14.6

## 2011-05-04 LAB — CBC
HCT: 38.6
HCT: 38.5
Hemoglobin: 13
Hemoglobin: 13.1
Hemoglobin: 14.3
Hemoglobin: 13.4
MCHC: 34.7
RBC: 4.16
RBC: 4.2
RBC: 4.61
RDW: 12.3
RDW: 12.7
RDW: 12.8
WBC: 10.3
WBC: 10.3

## 2011-05-04 LAB — CARDIAC PANEL(CRET KIN+CKTOT+MB+TROPI)
CK, MB: 2.8
CK, MB: 3.7
Relative Index: 2.6 — ABNORMAL HIGH
Relative Index: INVALID
Total CK: 50
Troponin I: 0.04

## 2011-05-04 LAB — DIFFERENTIAL
Lymphocytes Relative: 9 — ABNORMAL LOW
Lymphs Abs: 1.1
Monocytes Relative: 8
Neutro Abs: 10 — ABNORMAL HIGH
Neutrophils Relative %: 83 — ABNORMAL HIGH

## 2011-05-04 LAB — CK TOTAL AND CKMB (NOT AT ARMC)
CK, MB: 4.1 — ABNORMAL HIGH
Total CK: 77

## 2011-05-04 LAB — TSH: TSH: 1.29

## 2011-08-28 DIAGNOSIS — H4011X0 Primary open-angle glaucoma, stage unspecified: Secondary | ICD-10-CM | POA: Diagnosis not present

## 2011-08-28 DIAGNOSIS — H409 Unspecified glaucoma: Secondary | ICD-10-CM | POA: Diagnosis not present

## 2011-10-24 ENCOUNTER — Encounter: Payer: Self-pay | Admitting: Cardiology

## 2011-10-24 ENCOUNTER — Ambulatory Visit (INDEPENDENT_AMBULATORY_CARE_PROVIDER_SITE_OTHER): Payer: Medicare Other | Admitting: Cardiology

## 2011-10-24 VITALS — BP 136/70 | HR 72 | Ht <= 58 in | Wt 101.0 lb

## 2011-10-24 DIAGNOSIS — I4891 Unspecified atrial fibrillation: Secondary | ICD-10-CM | POA: Diagnosis not present

## 2011-10-24 DIAGNOSIS — I519 Heart disease, unspecified: Secondary | ICD-10-CM

## 2011-10-24 DIAGNOSIS — I10 Essential (primary) hypertension: Secondary | ICD-10-CM

## 2011-10-24 NOTE — Assessment & Plan Note (Signed)
History of dilated cardiomyopathy diagnosed in 2008 with an ejection fraction 20-25%. With medical treatment is normalized an ejection fraction of 45-55% in 2009. She remains asymptomatic. Continue with carvedilol. Her ARB was held because of hypotension.

## 2011-10-24 NOTE — Assessment & Plan Note (Signed)
No symptoms of recurrent atrial fibrillation. She is in sinus rhythm today. We will continue with her risk factor modification.

## 2011-10-24 NOTE — Progress Notes (Signed)
Alicia Gomez Date of Birth: 1926-06-01   History of Present Illness: Alicia Gomez is seen for yearly followup. She really denies any significant cardiac complaints. She has had no palpitations dizziness. She denies any shortness of breath or edema. She has had difficulty walking because of tendinitis and arthritis in her legs. She was taken off of Micardis for low blood pressure and since then her blood pressure readings have been normal. She was hospitalized several months ago with severe vertigo. She did have a CT of her head which demonstrated a anterior communicating artery cerebral aneurysm. She has been managed medically. She denies any headache or visual changes.  Current Outpatient Prescriptions on File Prior to Visit  Medication Sig Dispense Refill  . acetaminophen (TYLENOL) 325 MG tablet Take 650 mg by mouth at bedtime.        Marland Kitchen aspirin 81 MG tablet Take 81 mg by mouth daily.        . bimatoprost (LUMIGAN) 0.03 % ophthalmic drops Apply 1 drop to eye at bedtime.        . carvedilol (COREG) 25 MG tablet Take 12.5 mg by mouth 2 (two) times daily with a meal.        . Cholecalciferol (VITAMIN D PO) Take by mouth daily.        . cyclobenzaprine (FLEXERIL) 10 MG tablet Take 10 mg by mouth 2 (two) times daily.        Marland Kitchen escitalopram (LEXAPRO) 10 MG tablet Take 10 mg by mouth daily.        Marland Kitchen gabapentin (NEURONTIN) 100 MG capsule Take 100 mg by mouth daily. 1-2 DAILY       . simvastatin (ZOCOR) 20 MG tablet Take 20 mg by mouth at bedtime.       . timolol (BETIMOL) 0.5 % ophthalmic solution Apply 1 drop to eye 1 dose over 46 hours.          Allergies  Allergen Reactions  . Codeine     Past Medical History  Diagnosis Date  . Left ventricular dysfunction     NOW NORMALIZED  . Hypertension   . Dyslipidemia   . Osteoarthritis   . Atrial fibrillation     RESOLVED  . Kyphosis     Past Surgical History  Procedure Date  . Cataract extraction     History  Smoking status  .  Never Smoker   Smokeless tobacco  . Not on file    History  Alcohol Use No    Family History  Problem Relation Age of Onset  . Heart disease Brother   . Hypertension Other     6 siblings died with heart disease    Review of Systems:   All other systems were reviewed and are negative.  Physical Exam: BP 136/70  Pulse 72  Ht 4' (1.219 m)  Wt 101 lb (45.813 kg)  BMI 30.82 kg/m2 She is a pleasant elderly white female in no acute distress. Her HEENT exam is unremarkable. She has no JVD or bruits. Lungs are clear. Cardiac exam reveals a regular rate and rhythm without gallop, murmur, or click. She has marked kyphosis. She has no edema. Pedal pulses are good. LABORATORY DATA: ECG demonstrates normal sinus rhythm with an interventricular conduction delay and left axis deviation. There is poor R wave progression across the precordial leads. Date in November 2012 her A1c was 5.7%. BUN 15, creatinine 1.4. Total cholesterol 196, triglycerides 125, HDL 74, LDL 97. Assessment / Plan:

## 2011-10-24 NOTE — Patient Instructions (Signed)
Continue your current medication.  I will see you again in 1 year.  

## 2011-12-21 ENCOUNTER — Encounter: Payer: Self-pay | Admitting: Cardiology

## 2011-12-21 DIAGNOSIS — I10 Essential (primary) hypertension: Secondary | ICD-10-CM | POA: Diagnosis not present

## 2011-12-21 DIAGNOSIS — E7800 Pure hypercholesterolemia, unspecified: Secondary | ICD-10-CM | POA: Diagnosis not present

## 2011-12-21 DIAGNOSIS — M818 Other osteoporosis without current pathological fracture: Secondary | ICD-10-CM | POA: Diagnosis not present

## 2012-01-02 ENCOUNTER — Encounter: Payer: Self-pay | Admitting: Cardiology

## 2012-01-02 DIAGNOSIS — E559 Vitamin D deficiency, unspecified: Secondary | ICD-10-CM | POA: Diagnosis not present

## 2012-01-02 DIAGNOSIS — I4891 Unspecified atrial fibrillation: Secondary | ICD-10-CM | POA: Diagnosis not present

## 2012-01-02 DIAGNOSIS — I10 Essential (primary) hypertension: Secondary | ICD-10-CM | POA: Diagnosis not present

## 2012-01-15 DIAGNOSIS — H4011X0 Primary open-angle glaucoma, stage unspecified: Secondary | ICD-10-CM | POA: Diagnosis not present

## 2012-01-15 DIAGNOSIS — H409 Unspecified glaucoma: Secondary | ICD-10-CM | POA: Diagnosis not present

## 2012-04-26 DIAGNOSIS — Z23 Encounter for immunization: Secondary | ICD-10-CM | POA: Diagnosis not present

## 2012-05-13 DIAGNOSIS — H409 Unspecified glaucoma: Secondary | ICD-10-CM | POA: Diagnosis not present

## 2012-05-13 DIAGNOSIS — H4011X0 Primary open-angle glaucoma, stage unspecified: Secondary | ICD-10-CM | POA: Diagnosis not present

## 2012-07-01 DIAGNOSIS — I10 Essential (primary) hypertension: Secondary | ICD-10-CM | POA: Diagnosis not present

## 2012-07-01 DIAGNOSIS — R7309 Other abnormal glucose: Secondary | ICD-10-CM | POA: Diagnosis not present

## 2012-07-09 DIAGNOSIS — E7800 Pure hypercholesterolemia, unspecified: Secondary | ICD-10-CM | POA: Diagnosis not present

## 2012-07-09 DIAGNOSIS — I4891 Unspecified atrial fibrillation: Secondary | ICD-10-CM | POA: Diagnosis not present

## 2012-07-09 DIAGNOSIS — I10 Essential (primary) hypertension: Secondary | ICD-10-CM | POA: Diagnosis not present

## 2012-07-09 DIAGNOSIS — R7309 Other abnormal glucose: Secondary | ICD-10-CM | POA: Diagnosis not present

## 2012-07-18 ENCOUNTER — Encounter (INDEPENDENT_AMBULATORY_CARE_PROVIDER_SITE_OTHER): Payer: Medicare Other

## 2012-07-18 DIAGNOSIS — R0989 Other specified symptoms and signs involving the circulatory and respiratory systems: Secondary | ICD-10-CM

## 2012-08-13 DIAGNOSIS — H409 Unspecified glaucoma: Secondary | ICD-10-CM | POA: Diagnosis not present

## 2012-08-13 DIAGNOSIS — H4011X0 Primary open-angle glaucoma, stage unspecified: Secondary | ICD-10-CM | POA: Diagnosis not present

## 2012-10-07 DIAGNOSIS — R7309 Other abnormal glucose: Secondary | ICD-10-CM | POA: Diagnosis not present

## 2012-10-07 DIAGNOSIS — M810 Age-related osteoporosis without current pathological fracture: Secondary | ICD-10-CM | POA: Diagnosis not present

## 2012-12-03 ENCOUNTER — Encounter: Payer: Self-pay | Admitting: Cardiology

## 2012-12-03 ENCOUNTER — Ambulatory Visit (INDEPENDENT_AMBULATORY_CARE_PROVIDER_SITE_OTHER): Payer: Medicare Other | Admitting: Cardiology

## 2012-12-03 VITALS — BP 144/81 | HR 91 | Ht <= 58 in | Wt 95.4 lb

## 2012-12-03 DIAGNOSIS — I4891 Unspecified atrial fibrillation: Secondary | ICD-10-CM | POA: Diagnosis not present

## 2012-12-03 DIAGNOSIS — I10 Essential (primary) hypertension: Secondary | ICD-10-CM

## 2012-12-03 DIAGNOSIS — I519 Heart disease, unspecified: Secondary | ICD-10-CM

## 2012-12-03 NOTE — Progress Notes (Signed)
Alicia Gomez Date of Birth: 1925-12-20   History of Present Illness: Alicia Gomez is seen for yearly followup. She has a history of dilated cardiomyopathy diagnosed in 2008 with an ejection fraction at that time of 20-25%. With medical management her ejection fraction improved to 45-55% in 2009. She was unable to tolerate a ARB due to hypotension. She has been on chronic carvedilol therapy. She also has a history of prior atrial fibrillation. On followup today she is feeling very well. She has had no significant medical problems this past year. She denies any palpitations, chest pain, or shortness of breath. She has some difficulty walking and uses a walker if she has to walk any distance.  Current Outpatient Prescriptions on File Prior to Visit  Medication Sig Dispense Refill  . aspirin 81 MG tablet Take 81 mg by mouth daily.        . bimatoprost (LUMIGAN) 0.03 % ophthalmic drops Apply 1 drop to eye at bedtime.        . carvedilol (COREG) 25 MG tablet Take 12.5 mg by mouth 2 (two) times daily with a meal.        . Cholecalciferol (VITAMIN D PO) Take by mouth daily.        . cyclobenzaprine (FLEXERIL) 10 MG tablet Take 10 mg by mouth 2 (two) times daily.        Marland Kitchen escitalopram (LEXAPRO) 10 MG tablet Take 10 mg by mouth daily.        Marland Kitchen gabapentin (NEURONTIN) 100 MG capsule Take 100 mg by mouth daily. 1-2 DAILY       . meclizine (ANTIVERT) 25 MG tablet Take 25 mg by mouth 2 (two) times daily. 1/2 tablets BID      . simvastatin (ZOCOR) 20 MG tablet Take 20 mg by mouth at bedtime.       . timolol (BETIMOL) 0.5 % ophthalmic solution Apply 1 drop to eye 1 dose over 46 hours.        Marland Kitchen acetaminophen (TYLENOL) 325 MG tablet Take 650 mg by mouth at bedtime.         No current facility-administered medications on file prior to visit.    Allergies  Allergen Reactions  . Codeine     Past Medical History  Diagnosis Date  . Left ventricular dysfunction     NOW NORMALIZED  . Hypertension   .  Dyslipidemia   . Osteoarthritis   . Atrial fibrillation     RESOLVED  . Kyphosis     Past Surgical History  Procedure Laterality Date  . Cataract extraction      History  Smoking status  . Never Smoker   Smokeless tobacco  . Not on file    History  Alcohol Use No    Family History  Problem Relation Age of Onset  . Heart disease Brother   . Hypertension Other     6 siblings died with heart disease    Review of Systems:   All other systems were reviewed and are negative.  Physical Exam: BP 144/81  Pulse 91  Ht 4' (1.219 m)  Wt 95 lb 6.4 oz (43.273 kg)  BMI 29.12 kg/m2 She is a pleasant elderly white female in no acute distress. Her HEENT exam is unremarkable. She has no JVD or bruits. Lungs are clear. Cardiac exam reveals a regular rate and rhythm without gallop, murmur, or click. She has marked kyphosis. She has no edema. Pedal pulses are good. LABORATORY DATA: ECG demonstrates  normal sinus rhythm with an interventricular conduction delay and left axis deviation. There is poor R wave progression across the precordial leads. This is unchanged from April of 2013.  Assessment / Plan: 1. Dilated cardiomyopathy. Well compensated. Continue carvedilol.  2. History of atrial fibrillation without recurrence. I will followup again in one year

## 2012-12-23 DIAGNOSIS — H409 Unspecified glaucoma: Secondary | ICD-10-CM | POA: Diagnosis not present

## 2012-12-23 DIAGNOSIS — H4011X0 Primary open-angle glaucoma, stage unspecified: Secondary | ICD-10-CM | POA: Diagnosis not present

## 2013-01-13 ENCOUNTER — Encounter: Payer: Self-pay | Admitting: Cardiology

## 2013-01-13 DIAGNOSIS — I10 Essential (primary) hypertension: Secondary | ICD-10-CM | POA: Diagnosis not present

## 2013-01-13 DIAGNOSIS — E559 Vitamin D deficiency, unspecified: Secondary | ICD-10-CM | POA: Diagnosis not present

## 2013-01-13 DIAGNOSIS — Z0000 Encounter for general adult medical examination without abnormal findings: Secondary | ICD-10-CM | POA: Diagnosis not present

## 2013-01-13 DIAGNOSIS — R7309 Other abnormal glucose: Secondary | ICD-10-CM | POA: Diagnosis not present

## 2013-01-20 DIAGNOSIS — E559 Vitamin D deficiency, unspecified: Secondary | ICD-10-CM | POA: Diagnosis not present

## 2013-01-20 DIAGNOSIS — E7800 Pure hypercholesterolemia, unspecified: Secondary | ICD-10-CM | POA: Diagnosis not present

## 2013-01-20 DIAGNOSIS — I4891 Unspecified atrial fibrillation: Secondary | ICD-10-CM | POA: Diagnosis not present

## 2013-01-20 DIAGNOSIS — I10 Essential (primary) hypertension: Secondary | ICD-10-CM | POA: Diagnosis not present

## 2013-04-03 DIAGNOSIS — D165 Benign neoplasm of lower jaw bone: Secondary | ICD-10-CM | POA: Diagnosis not present

## 2013-04-03 DIAGNOSIS — D3701 Neoplasm of uncertain behavior of lip: Secondary | ICD-10-CM | POA: Diagnosis not present

## 2013-04-03 DIAGNOSIS — M272 Inflammatory conditions of jaws: Secondary | ICD-10-CM | POA: Diagnosis not present

## 2013-05-19 DIAGNOSIS — Z23 Encounter for immunization: Secondary | ICD-10-CM | POA: Diagnosis not present

## 2013-05-23 DIAGNOSIS — D165 Benign neoplasm of lower jaw bone: Secondary | ICD-10-CM | POA: Diagnosis not present

## 2013-05-23 DIAGNOSIS — M272 Inflammatory conditions of jaws: Secondary | ICD-10-CM | POA: Diagnosis not present

## 2013-06-02 DIAGNOSIS — M272 Inflammatory conditions of jaws: Secondary | ICD-10-CM | POA: Diagnosis not present

## 2013-06-02 DIAGNOSIS — D165 Benign neoplasm of lower jaw bone: Secondary | ICD-10-CM | POA: Diagnosis not present

## 2013-06-23 DIAGNOSIS — H4011X0 Primary open-angle glaucoma, stage unspecified: Secondary | ICD-10-CM | POA: Diagnosis not present

## 2013-06-23 DIAGNOSIS — H409 Unspecified glaucoma: Secondary | ICD-10-CM | POA: Diagnosis not present

## 2013-06-26 ENCOUNTER — Ambulatory Visit (INDEPENDENT_AMBULATORY_CARE_PROVIDER_SITE_OTHER): Payer: Medicare Other | Admitting: Podiatry

## 2013-06-26 ENCOUNTER — Ambulatory Visit (INDEPENDENT_AMBULATORY_CARE_PROVIDER_SITE_OTHER): Payer: Medicare Other

## 2013-06-26 ENCOUNTER — Encounter: Payer: Self-pay | Admitting: Podiatry

## 2013-06-26 DIAGNOSIS — M2040 Other hammer toe(s) (acquired), unspecified foot: Secondary | ICD-10-CM | POA: Diagnosis not present

## 2013-06-26 DIAGNOSIS — L02619 Cutaneous abscess of unspecified foot: Secondary | ICD-10-CM

## 2013-06-26 DIAGNOSIS — M79609 Pain in unspecified limb: Secondary | ICD-10-CM

## 2013-06-26 MED ORDER — CEPHALEXIN 500 MG PO CAPS: 500.0000 mg | ORAL_CAPSULE | Freq: Two times a day (BID) | ORAL | Status: DC

## 2013-06-26 NOTE — Progress Notes (Signed)
   Subjective:    Patient ID: Alicia Gomez, female    DOB: [birth year], 79 y.o.   MRN: [id number]  HPIN toe pain       L Right 2nd toe       D years       O 2 weeks       C red, swollen, and painful       A plastic shield       T peroxide bath     Review of Systems  Constitutional: Negative.   HENT:       Dental plates caused a sore that required surgery to remove bone  Eyes: Negative.   Respiratory: Negative.   Cardiovascular: Negative.   Gastrointestinal: Negative.   Endocrine:       Dr.Kim has pt on Cinnamon to regulate blood sugar  Genitourinary: Negative.   Musculoskeletal: Positive for back pain.  Skin: Negative.   Allergic/Immunologic: Negative.   Neurological: Negative.   Hematological: Negative.   Psychiatric/Behavioral: Negative.        Objective:   Physical Exam        Assessment & Plan:

## 2013-06-26 NOTE — Progress Notes (Signed)
Subjective:     Patient ID: Alicia Gomez, female   DOB: 10-14-1925, 79 y.o.   MRN: [id number]  Toe Pain    patient presents with daughter stating I'm having trouble with my right second toe. States it's been red and swollen for a couple weeks and I have had a corn between the big toe and second toe   Review of Systems  All other systems reviewed and are negative.       Objective:   Physical Exam  Vitals reviewed. Constitutional: She is oriented to person, place, and time.  Musculoskeletal: Normal range of motion.  Neurological: She is oriented to person, place, and time.  Skin: Skin is dry.   patient's right second toe medial side has keratotic lesion distal with a small amount of drainage that it's localized in nature with no odor noted neurovascular status is diminished bilaterally muscle strength is diminished secondary to a     Assessment:     Distal keratotic lesion that has probably developed mild localized bacterial infiltration    Plan:     H&P reviewed with patient and x-ray taken. Today I debrided tissue and applied a thick pad excised on soaks and placed on cephalexin 500 mg twice a day for 10 days. If any issues were to occur patient is to let us know

## 2013-12-02 DIAGNOSIS — I10 Essential (primary) hypertension: Secondary | ICD-10-CM | POA: Diagnosis not present

## 2013-12-02 DIAGNOSIS — E559 Vitamin D deficiency, unspecified: Secondary | ICD-10-CM | POA: Diagnosis not present

## 2013-12-04 ENCOUNTER — Ambulatory Visit (INDEPENDENT_AMBULATORY_CARE_PROVIDER_SITE_OTHER): Payer: Medicare Other | Admitting: Cardiology

## 2013-12-04 ENCOUNTER — Ambulatory Visit: Payer: Medicare Other | Admitting: Cardiology

## 2013-12-04 ENCOUNTER — Encounter: Payer: Self-pay | Admitting: Cardiology

## 2013-12-04 VITALS — BP 150/84 | HR 72 | Ht <= 58 in | Wt 85.0 lb

## 2013-12-04 DIAGNOSIS — I519 Heart disease, unspecified: Secondary | ICD-10-CM

## 2013-12-04 DIAGNOSIS — I4891 Unspecified atrial fibrillation: Secondary | ICD-10-CM | POA: Diagnosis not present

## 2013-12-04 DIAGNOSIS — I10 Essential (primary) hypertension: Secondary | ICD-10-CM | POA: Diagnosis not present

## 2013-12-04 NOTE — Progress Notes (Signed)
Alicia Gomez Date of Birth: 01/26/1926   History of Present Illness: Alicia Gomez is seen for yearly followup. She has a history of dilated cardiomyopathy diagnosed in 2008 with an ejection fraction at that time of 20-25%. With medical management her ejection fraction improved to 45-55% in 2009. She was unable to tolerate a ARB due to hypotension. She has been on chronic carvedilol therapy. She also has a history of prior atrial fibrillation. On followup today she is feeling very well. She no longer drives but stills goes to church and shopping with her nephew. BP at home has been normal. No chest pain, SOB, or palpitations.  Current Outpatient Prescriptions on File Prior to Visit  Medication Sig Dispense Refill  . acetaminophen (TYLENOL) 325 MG tablet Take 650 mg by mouth at bedtime.        Marland Kitchen aspirin 81 MG tablet Take 81 mg by mouth daily.        . bimatoprost (LUMIGAN) 0.03 % ophthalmic drops Apply 1 drop to eye at bedtime.        . carvedilol (COREG) 25 MG tablet Take 12.5 mg by mouth 2 (two) times daily with a meal.        . cephALEXin (KEFLEX) 500 MG capsule Take 1 capsule (500 mg total) by mouth 2 (two) times daily.  20 capsule  1  . Cholecalciferol (VITAMIN D PO) Take by mouth daily.        . Cinnamon 500 MG TABS Take 1 tablet by mouth 1 day or 1 dose.      . cyclobenzaprine (FLEXERIL) 10 MG tablet Take 10 mg by mouth 2 (two) times daily.        Marland Kitchen escitalopram (LEXAPRO) 10 MG tablet Take 10 mg by mouth daily.        Marland Kitchen gabapentin (NEURONTIN) 100 MG capsule Take 100 mg by mouth daily. 1-2 DAILY       . meclizine (ANTIVERT) 25 MG tablet Take 25 mg by mouth 2 (two) times daily. 1/2 tablets BID      . simvastatin (ZOCOR) 20 MG tablet Take 20 mg by mouth at bedtime.       . timolol (BETIMOL) 0.5 % ophthalmic solution Apply 1 drop to eye 1 dose over 46 hours.         No current facility-administered medications on file prior to visit.    Allergies  Allergen Reactions  . Codeine      Past Medical History  Diagnosis Date  . Left ventricular dysfunction     NOW NORMALIZED  . Hypertension   . Dyslipidemia   . Osteoarthritis   . Atrial fibrillation     RESOLVED  . Kyphosis   . Osteoporosis     Past Surgical History  Procedure Laterality Date  . Cataract extraction      History  Smoking status  . Never Smoker   Smokeless tobacco  . Not on file    History  Alcohol Use No    Family History  Problem Relation Age of Onset  . Heart disease Brother   . Hypertension Other     6 siblings died with heart disease    Review of Systems:   All other systems were reviewed and are negative.  Physical Exam: BP 150/84  Pulse 72  Ht 4' (1.219 m)  Wt 85 lb (38.556 kg)  BMI 25.95 kg/m2 She is a pleasant elderly white female in no acute distress. Her HEENT exam is unremarkable. She has  no JVD or bruits. Lungs are clear. Cardiac exam reveals a regular rate and rhythm without gallop, murmur, or click. She has marked kyphosis. She has no edema. Pedal pulses are good.  LABORATORY DATA: ECG demonstrates normal sinus rhythm with old septal infarct. No acute change.  Assessment / Plan: 1. Dilated cardiomyopathy. Well compensated. Continue carvedilol. Last EF 45-55%.  2. History of atrial fibrillation without recurrence.  I will followup again in one year

## 2013-12-04 NOTE — Patient Instructions (Signed)
Continue your current therapy  I will see you in one year   

## 2013-12-23 DIAGNOSIS — R7309 Other abnormal glucose: Secondary | ICD-10-CM | POA: Diagnosis not present

## 2013-12-23 DIAGNOSIS — E559 Vitamin D deficiency, unspecified: Secondary | ICD-10-CM | POA: Diagnosis not present

## 2013-12-23 DIAGNOSIS — I10 Essential (primary) hypertension: Secondary | ICD-10-CM | POA: Diagnosis not present

## 2013-12-26 DIAGNOSIS — H409 Unspecified glaucoma: Secondary | ICD-10-CM | POA: Diagnosis not present

## 2013-12-26 DIAGNOSIS — H4011X0 Primary open-angle glaucoma, stage unspecified: Secondary | ICD-10-CM | POA: Diagnosis not present

## 2014-01-01 ENCOUNTER — Encounter (INDEPENDENT_AMBULATORY_CARE_PROVIDER_SITE_OTHER): Payer: Medicare Other | Admitting: Ophthalmology

## 2014-01-01 DIAGNOSIS — H43819 Vitreous degeneration, unspecified eye: Secondary | ICD-10-CM

## 2014-01-01 DIAGNOSIS — H33309 Unspecified retinal break, unspecified eye: Secondary | ICD-10-CM

## 2014-01-01 DIAGNOSIS — I10 Essential (primary) hypertension: Secondary | ICD-10-CM

## 2014-04-16 DIAGNOSIS — Z23 Encounter for immunization: Secondary | ICD-10-CM | POA: Diagnosis not present

## 2014-07-07 DIAGNOSIS — H524 Presbyopia: Secondary | ICD-10-CM | POA: Diagnosis not present

## 2014-07-07 DIAGNOSIS — H33322 Round hole, left eye: Secondary | ICD-10-CM | POA: Diagnosis not present

## 2014-07-07 DIAGNOSIS — H4011X2 Primary open-angle glaucoma, moderate stage: Secondary | ICD-10-CM | POA: Diagnosis not present

## 2014-07-15 DIAGNOSIS — I10 Essential (primary) hypertension: Secondary | ICD-10-CM | POA: Diagnosis not present

## 2014-07-15 DIAGNOSIS — R7309 Other abnormal glucose: Secondary | ICD-10-CM | POA: Diagnosis not present

## 2014-07-22 DIAGNOSIS — E780 Pure hypercholesterolemia: Secondary | ICD-10-CM | POA: Diagnosis not present

## 2014-07-22 DIAGNOSIS — R739 Hyperglycemia, unspecified: Secondary | ICD-10-CM | POA: Diagnosis not present

## 2014-07-22 DIAGNOSIS — I4891 Unspecified atrial fibrillation: Secondary | ICD-10-CM | POA: Diagnosis not present

## 2014-07-22 DIAGNOSIS — I10 Essential (primary) hypertension: Secondary | ICD-10-CM | POA: Diagnosis not present

## 2014-09-10 ENCOUNTER — Telehealth: Payer: Self-pay | Admitting: Cardiology

## 2014-09-11 NOTE — Telephone Encounter (Signed)
Close encounter 

## 2014-11-10 DIAGNOSIS — H01001 Unspecified blepharitis right upper eyelid: Secondary | ICD-10-CM | POA: Diagnosis not present

## 2014-11-10 DIAGNOSIS — H4011X3 Primary open-angle glaucoma, severe stage: Secondary | ICD-10-CM | POA: Diagnosis not present

## 2014-11-10 DIAGNOSIS — H01004 Unspecified blepharitis left upper eyelid: Secondary | ICD-10-CM | POA: Diagnosis not present

## 2014-12-10 ENCOUNTER — Encounter: Payer: Self-pay | Admitting: Cardiology

## 2014-12-10 ENCOUNTER — Ambulatory Visit (INDEPENDENT_AMBULATORY_CARE_PROVIDER_SITE_OTHER): Payer: Medicare Other | Admitting: Cardiology

## 2014-12-10 VITALS — BP 142/80 | HR 62 | Ht <= 58 in | Wt 91.6 lb

## 2014-12-10 DIAGNOSIS — I519 Heart disease, unspecified: Secondary | ICD-10-CM

## 2014-12-10 DIAGNOSIS — I10 Essential (primary) hypertension: Secondary | ICD-10-CM

## 2014-12-10 DIAGNOSIS — E785 Hyperlipidemia, unspecified: Secondary | ICD-10-CM

## 2014-12-10 NOTE — Patient Instructions (Signed)
Continue your current therapy  I will see you in one year   

## 2014-12-10 NOTE — Progress Notes (Signed)
Alicia Gomez Date of Birth: [birth year]   History of Present Illness: Alicia Gomez is seen for yearly followup cardiomyopathy. She has a history of dilated cardiomyopathy diagnosed in 2008 with an ejection fraction at that time of 20-25%. With medical management her ejection fraction improved to 45-55% in 2009. She was unable to tolerate a ARB due to hypotension. She has been on chronic carvedilol therapy. She also has a remote history of atrial fibrillation. On followup today she is feeling very well.  No chest pain, SOB, or palpitations. She is sedentary and doesn't get out of the house very much due to her physical limitations.  Current Outpatient Prescriptions on File Prior to Visit  Medication Sig Dispense Refill  . aspirin 81 MG tablet Take 81 mg by mouth daily.      . bimatoprost (LUMIGAN) 0.03 % ophthalmic drops Apply 1 drop to eye at bedtime.      . carvedilol (COREG) 25 MG tablet Take 12.5 mg by mouth 2 (two) times daily with a meal.      . cephALEXin (KEFLEX) 500 MG capsule Take 1 capsule (500 mg total) by mouth 2 (two) times daily. 20 capsule 1  . Cholecalciferol (VITAMIN D PO) Take by mouth daily.      . Cinnamon 500 MG TABS Take 1 tablet by mouth 1 day or 1 dose.    . cyclobenzaprine (FLEXERIL) 10 MG tablet Take 10 mg by mouth 2 (two) times daily.      Marland Kitchen gabapentin (NEURONTIN) 100 MG capsule Take 100 mg by mouth daily. 1-2 DAILY     . meclizine (ANTIVERT) 25 MG tablet Take 25 mg by mouth 2 (two) times daily. 1/2 tablets BID    . simvastatin (ZOCOR) 20 MG tablet Take 20 mg by mouth at bedtime.     . timolol (BETIMOL) 0.5 % ophthalmic solution Apply 1 drop to eye 1 dose over 46 hours.       No current facility-administered medications on file prior to visit.    Allergies  Allergen Reactions  . Codeine     Past Medical History  Diagnosis Date  . Left ventricular dysfunction     NOW NORMALIZED  . Hypertension   . Dyslipidemia   . Osteoarthritis   . Atrial fibrillation      RESOLVED  . Kyphosis   . Osteoporosis     Past Surgical History  Procedure Laterality Date  . Cataract extraction      History  Smoking status  . Never Smoker   Smokeless tobacco  . Not on file    History  Alcohol Use No    Family History  Problem Relation Age of Onset  . Heart disease Brother   . Hypertension Other     6 siblings died with heart disease    Review of Systems:   All other systems were reviewed and are negative.  Physical Exam: BP 142/80 mmHg  Pulse 62  Ht 4' (1.219 m)  Wt 91 lb 9.6 oz (41.549 kg)  BMI 27.96 kg/m2 She is a pleasant elderly white female in no acute distress. Her HEENT exam is unremarkable. She has no JVD or bruits. Lungs are clear. Cardiac exam reveals a regular rate and rhythm without gallop, murmur, or click. She has marked kyphosis. She has no edema. Pedal pulses are good.  LABORATORY DATA: ECG demonstrates normal sinus rhythm with old septal infarct. LAD. No acute change. I have personally reviewed and interpreted this study.   Assessment /  Plan: 1. Dilated cardiomyopathy. Well compensated. Continue carvedilol. Last EF 45-55%.  2. History of atrial fibrillation without recurrence.  I will followup again in one year

## 2014-12-23 DIAGNOSIS — E559 Vitamin D deficiency, unspecified: Secondary | ICD-10-CM | POA: Diagnosis not present

## 2014-12-23 DIAGNOSIS — I10 Essential (primary) hypertension: Secondary | ICD-10-CM | POA: Diagnosis not present

## 2014-12-30 ENCOUNTER — Encounter: Payer: Self-pay | Admitting: Cardiology

## 2014-12-30 DIAGNOSIS — E559 Vitamin D deficiency, unspecified: Secondary | ICD-10-CM | POA: Diagnosis not present

## 2014-12-30 DIAGNOSIS — E780 Pure hypercholesterolemia: Secondary | ICD-10-CM | POA: Diagnosis not present

## 2014-12-30 DIAGNOSIS — I10 Essential (primary) hypertension: Secondary | ICD-10-CM | POA: Diagnosis not present

## 2014-12-30 DIAGNOSIS — I4891 Unspecified atrial fibrillation: Secondary | ICD-10-CM | POA: Diagnosis not present

## 2015-02-09 DIAGNOSIS — H4011X3 Primary open-angle glaucoma, severe stage: Secondary | ICD-10-CM | POA: Diagnosis not present

## 2015-04-01 DIAGNOSIS — Z23 Encounter for immunization: Secondary | ICD-10-CM | POA: Diagnosis not present

## 2015-06-15 DIAGNOSIS — H524 Presbyopia: Secondary | ICD-10-CM | POA: Diagnosis not present

## 2015-06-15 DIAGNOSIS — H52223 Regular astigmatism, bilateral: Secondary | ICD-10-CM | POA: Diagnosis not present

## 2015-06-15 DIAGNOSIS — H04123 Dry eye syndrome of bilateral lacrimal glands: Secondary | ICD-10-CM | POA: Diagnosis not present

## 2015-06-15 DIAGNOSIS — H401133 Primary open-angle glaucoma, bilateral, severe stage: Secondary | ICD-10-CM | POA: Diagnosis not present

## 2015-07-01 DIAGNOSIS — I4891 Unspecified atrial fibrillation: Secondary | ICD-10-CM | POA: Diagnosis not present

## 2015-07-01 DIAGNOSIS — E559 Vitamin D deficiency, unspecified: Secondary | ICD-10-CM | POA: Diagnosis not present

## 2015-07-01 DIAGNOSIS — I10 Essential (primary) hypertension: Secondary | ICD-10-CM | POA: Diagnosis not present

## 2015-07-07 DIAGNOSIS — I480 Paroxysmal atrial fibrillation: Secondary | ICD-10-CM | POA: Diagnosis not present

## 2015-07-07 DIAGNOSIS — E7800 Pure hypercholesterolemia, unspecified: Secondary | ICD-10-CM | POA: Diagnosis not present

## 2015-07-07 DIAGNOSIS — I10 Essential (primary) hypertension: Secondary | ICD-10-CM | POA: Diagnosis not present

## 2015-07-07 DIAGNOSIS — E559 Vitamin D deficiency, unspecified: Secondary | ICD-10-CM | POA: Diagnosis not present

## 2015-08-20 DIAGNOSIS — H401133 Primary open-angle glaucoma, bilateral, severe stage: Secondary | ICD-10-CM | POA: Diagnosis not present

## 2015-09-08 ENCOUNTER — Encounter (HOSPITAL_BASED_OUTPATIENT_CLINIC_OR_DEPARTMENT_OTHER): Payer: Self-pay

## 2015-09-08 ENCOUNTER — Emergency Department (HOSPITAL_BASED_OUTPATIENT_CLINIC_OR_DEPARTMENT_OTHER)
Admission: EM | Admit: 2015-09-08 | Discharge: 2015-09-08 | Disposition: A | Discharge status: Home / Self Care | Payer: Medicare Other | Attending: Emergency Medicine | Admitting: Emergency Medicine

## 2015-09-08 ENCOUNTER — Emergency Department (HOSPITAL_BASED_OUTPATIENT_CLINIC_OR_DEPARTMENT_OTHER): Payer: Medicare Other

## 2015-09-08 ENCOUNTER — Other Ambulatory Visit (HOSPITAL_BASED_OUTPATIENT_CLINIC_OR_DEPARTMENT_OTHER): Payer: Medicare Other

## 2015-09-08 DIAGNOSIS — Z7982 Long term (current) use of aspirin: Secondary | ICD-10-CM | POA: Diagnosis not present

## 2015-09-08 DIAGNOSIS — R51 Headache: Secondary | ICD-10-CM | POA: Diagnosis not present

## 2015-09-08 DIAGNOSIS — I10 Essential (primary) hypertension: Secondary | ICD-10-CM | POA: Insufficient documentation

## 2015-09-08 DIAGNOSIS — I671 Cerebral aneurysm, nonruptured: Secondary | ICD-10-CM | POA: Diagnosis not present

## 2015-09-08 DIAGNOSIS — Z79899 Other long term (current) drug therapy: Secondary | ICD-10-CM | POA: Insufficient documentation

## 2015-09-08 DIAGNOSIS — E785 Hyperlipidemia, unspecified: Secondary | ICD-10-CM | POA: Insufficient documentation

## 2015-09-08 DIAGNOSIS — R519 Headache, unspecified: Secondary | ICD-10-CM

## 2015-09-08 DIAGNOSIS — M40209 Unspecified kyphosis, site unspecified: Secondary | ICD-10-CM | POA: Insufficient documentation

## 2015-09-08 DIAGNOSIS — M1990 Unspecified osteoarthritis, unspecified site: Secondary | ICD-10-CM | POA: Diagnosis not present

## 2015-09-08 LAB — CBC WITH DIFFERENTIAL/PLATELET
BASOS ABS: 0.1 10*3/uL (ref 0.0–0.1)
BASOS PCT: 2 %
Eosinophils Absolute: 0.1 10*3/uL (ref 0.0–0.7)
Eosinophils Relative: 2 %
HEMATOCRIT: 38.7 % (ref 36.0–46.0)
HEMOGLOBIN: 12.7 g/dL (ref 12.0–15.0)
LYMPHS PCT: 33 %
Lymphs Abs: 1.8 10*3/uL (ref 0.7–4.0)
MCH: 29 pg (ref 26.0–34.0)
MCHC: 32.8 g/dL (ref 30.0–36.0)
MCV: 88.4 fL (ref 78.0–100.0)
Monocytes Absolute: 0.6 10*3/uL (ref 0.1–1.0)
Monocytes Relative: 10 %
NEUTROS PCT: 53 %
Neutro Abs: 2.9 10*3/uL (ref 1.7–7.7)
Platelets: 324 10*3/uL (ref 150–400)
RBC: 4.38 MIL/uL (ref 3.87–5.11)
RDW: 12.2 % (ref 11.5–15.5)
WBC: 5.5 10*3/uL (ref 4.0–10.5)

## 2015-09-08 LAB — BASIC METABOLIC PANEL
ANION GAP: 11 (ref 5–15)
BUN: 12 mg/dL (ref 6–20)
CHLORIDE: 97 mmol/L — AB (ref 101–111)
CO2: 27 mmol/L (ref 22–32)
Calcium: 9.8 mg/dL (ref 8.9–10.3)
Creatinine, Ser: 0.91 mg/dL (ref 0.44–1.00)
GFR calc Af Amer: >60 mL/min (ref >60)
GFR calc non Af Amer: 55 mL/min — ABNORMAL LOW (ref >60)
Glucose, Bld: 119 mg/dL — ABNORMAL HIGH (ref 65–99)
POTASSIUM: 4.5 mmol/L (ref 3.5–5.1)
SODIUM: 135 mmol/L (ref 135–145)

## 2015-09-08 MED ORDER — ACETAMINOPHEN ER 650 MG PO TBCR: 650.0000 mg | EXTENDED_RELEASE_TABLET | Freq: Three times a day (TID) | ORAL | Status: AC | PRN

## 2015-09-08 MED ORDER — IOHEXOL 350 MG/ML SOLN
75.0000 mL | Freq: Once | INTRAVENOUS | Status: AC | PRN
  Administered 2015-09-08: 75 mL via INTRAVENOUS

## 2015-09-08 NOTE — ED Notes (Signed)
Med student at bedside

## 2015-09-08 NOTE — ED Provider Notes (Signed)
CSN: [id number]     Arrival date & time 09/08/15  1132 History   First MD Initiated Contact with Patient 09/08/15 1152     Chief Complaint  Patient presents with  . Headache     (Consider location/radiation/quality/duration/timing/severity/associated sxs/prior Treatment) HPI Comments: Pt comes in with cc of headaches. Pt has hx of brain AN. Reports that she has intermittent headaches, but last night she started having severe headaches around 10 pm. She wanted to come to the hospital, but didn't want to bother anyone - so she waited until this AM to tell her family. Pt still has the headache, but it is mild now. Pt reports that the pain started in the neck, below the ear, and moves towards the ear and then towards the parietal area. There is no nausea, vomiting, visual complains, seizures, altered mental status, loss of consciousness, new weakness, or numbness, no gait instability.   ROS 10 Systems reviewed and are negative for acute change except as noted in the HPI.      Patient is a 82 y.o. female presenting with headaches. The history is provided by the patient.  Headache   Past Medical History  Diagnosis Date  . Left ventricular dysfunction     NOW NORMALIZED  . Hypertension   . Dyslipidemia   . Osteoarthritis   . Atrial fibrillation ([place])     RESOLVED  . Kyphosis   . Osteoporosis    Past Surgical History  Procedure Laterality Date  . Cataract extraction     Family History  Problem Relation Age of Onset  . Heart disease Brother   . Hypertension Other     6 siblings died with heart disease   Social History  Substance Use Topics  . Smoking status: Never Smoker   . Smokeless tobacco: None  . Alcohol Use: No   OB History    No data available     Review of Systems  Neurological: Positive for headaches.      Allergies  Codeine  Home Medications   Prior to Admission medications   Medication Sig Start Date End Date Taking? Authorizing Provider   acetaminophen (TYLENOL 8 HOUR) 650 MG CR tablet Take 1 tablet (650 mg total) by mouth every 8 (eight) hours as needed for pain. 09/08/15   Varney Biles, MD  aspirin 81 MG tablet Take 81 mg by mouth daily.      Historical Provider, MD  bimatoprost (LUMIGAN) 0.03 % ophthalmic drops Apply 1 drop to eye at bedtime.      Historical Provider, MD  carvedilol (COREG) 25 MG tablet Take 12.5 mg by mouth 2 (two) times daily with a meal.      Historical Provider, MD  Cholecalciferol (VITAMIN D PO) Take by mouth daily.      Historical Provider, MD  Cinnamon 500 MG TABS Take 1 tablet by mouth 1 day or 1 dose.    Historical Provider, MD  cyclobenzaprine (FLEXERIL) 10 MG tablet Take 10 mg by mouth 2 (two) times daily.      Historical Provider, MD  gabapentin (NEURONTIN) 100 MG capsule Take 100 mg by mouth daily. 1-2 DAILY     Historical Provider, MD  meclizine (ANTIVERT) 25 MG tablet Take 25 mg by mouth 2 (two) times daily. 1/2 tablets BID    Historical Provider, MD  simvastatin (ZOCOR) 20 MG tablet Take 20 mg by mouth at bedtime.  10/08/11   Historical Provider, MD  timolol (BETIMOL) 0.5 % ophthalmic solution Apply 1 drop  to eye 1 dose over 46 hours.      Historical Provider, MD   BP 175/64 mmHg  Pulse 68  Temp(Src) 97.5 F (36.4 C) (Oral)  Resp 18  Ht 4\' 2"  (1.27 m)  Wt 89 lb (40.37 kg)  BMI 25.03 kg/m2  SpO2 98% Physical Exam  Constitutional: She is oriented to person, place, and time. She appears well-developed.  HENT:  Head: Normocephalic and atraumatic.  Eyes: Conjunctivae and EOM are normal. Pupils are equal, round, and reactive to light.  Neck: Normal range of motion. Neck supple.  Cardiovascular: Normal rate, regular rhythm and normal heart sounds.   Pulmonary/Chest: Effort normal and breath sounds normal. No respiratory distress.  Abdominal: Soft. Bowel sounds are normal. She exhibits no distension. There is no tenderness. There is no rebound and no guarding.  Neurological: She is alert and  oriented to person, place, and time. No cranial nerve deficit. Coordination normal.  Cerebellar exam is normal (finger to nose) Sensory exam normal for bilateral upper and lower extremities - and patient is able to discriminate between sharp and dull. Motor exam is 4+/5   Skin: Skin is warm and dry.  Nursing note and vitals reviewed.   ED Course  Procedures (including critical care time) Labs Review Labs Reviewed  BASIC METABOLIC PANEL - Abnormal; Notable for the following:    Chloride 97 (*)    Glucose, Bld 119 (*)    GFR calc non Af Amer 55 (*)    All other components within normal limits  CBC WITH DIFFERENTIAL/PLATELET    Imaging Review Ct Angio Head W/cm &/or Wo Cm  09/08/2015  CLINICAL DATA:  Headache since yesterday.  Known cerebral aneurysm. EXAM: CT ANGIOGRAPHY HEAD TECHNIQUE: Multidetector CT imaging of the head was performed using the standard protocol during bolus administration of intravenous contrast. Multiplanar CT image reconstructions and MIPs were obtained to evaluate the vascular anatomy. CONTRAST:  56mL OMNIPAQUE IOHEXOL 350 MG/ML SOLN COMPARISON:  12/08/2010 FINDINGS: CT HEAD Brain: There is no evidence of acute cortical infarct, intracranial hemorrhage, mass, midline shift, or extra-axial fluid collection. Mild age related cerebral atrophy is noted. Hypodensities in the deep cerebral white matter bilaterally are nonspecific but compatible with minimal chronic small vessel ischemic disease. Calvarium and skull base: Advanced degenerative changes are partially visualized about the dens with prominent calcified pannus posteriorly. Focal deficiency in the midline of the anterior C1 arch may be developmental. Paranasal sinuses: Clear. Orbits: Prior bilateral cataract extraction. CTA HEAD Anterior circulation: The internal carotid arteries are patent from skullbase to carotid termini with mild calcified plaque but no significant stenosis. The right A1 segment is again noted to  be absent. Left A1 segment is widely patent, as is the anterior communicating artery. 7 x 4 x 4 mm bilobed anterior communicating artery aneurysm projects anterosuperiorly and is unchanged in size with a wide neck. There is an outpouching from the mid right M1 segment directed posteroinferiorly near the anterior temporal artery origin which measures 3 x 2 mm, compatible with a small aneurysm and unchanged. No significant MCA stenosis or major branch vessel occlusion is seen. Posterior circulation: The visualized distal vertebral arteries are patent with the left being dominant. PICA, AICA, and SCA origins are patent. Basilar artery is patent without stenosis. There is a small left posterior communicating artery. PCAs are patent with mild branch vessel irregularity bilaterally. The left P1 segment is mildly small in caliber diffusely, however this is much improved from the prior study. Venous sinuses: Patent.  Anatomic variants: Absent right A1. Delayed phase:No abnormal enhancement. IMPRESSION: 1. No evidence of acute intracranial abnormality. 2. 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 3. Unchanged 3 mm right M1 MCA aneurysm. 4. Improved patency of the left P1 segment. Electronically Signed   By: Logan Bores M.D.   On: 09/08/2015 15:01   I have personally reviewed and evaluated these images and lab results as part of my medical decision-making.   EKG Interpretation None      MDM   Final diagnoses:  Brain aneurysm  Acute nonintractable headache, unspecified headache type    Pt comes in with cc of headaches. She has hx of aneurysm, and had severe headaches - so we will get CT-A. She has no neurologic deficits, so large bleed or leak is unlikely. Will get CT-A reassess.  @3 :55: Pt on reassessment post CT states that the pain hasnt gotten worse. She was ambulated with walker, and did well. She has been advised to see her pcp and she doesn't have enough pain to want any meds - so she  will take tylenol at home. Strict ER return precautions have been discussed.    Varney Biles, MD 09/08/15 1630

## 2015-09-08 NOTE — ED Notes (Signed)
Patient transported to CT 

## 2015-09-08 NOTE — Discharge Instructions (Signed)
Below are your CT results. Luckily, aneurysm is appearing stable. See your doctor in 1 week.  Please return to the ER if the headache gets severe and in not improving, you have associated new one sided numbness, tingling, weakness or confusion, seizures, poor balance or poor vision.    FINDINGS: CT HEAD  Brain: There is no evidence of acute cortical infarct, intracranial hemorrhage, mass, midline shift, or extra-axial fluid collection. Mild age related cerebral atrophy is noted. Hypodensities in the deep cerebral white matter bilaterally are nonspecific but compatible with minimal chronic small vessel ischemic disease.  Calvarium and skull base: Advanced degenerative changes are partially visualized about the dens with prominent calcified pannus posteriorly. Focal deficiency in the midline of the anterior C1 arch may be developmental.  Paranasal sinuses: Clear.  Orbits: Prior bilateral cataract extraction.  CTA HEAD  Anterior circulation: The internal carotid arteries are patent from skullbase to carotid termini with mild calcified plaque but no significant stenosis. The right A1 segment is again noted to be absent. Left A1 segment is widely patent, as is the anterior communicating artery. 7 x 4 x 4 mm bilobed anterior communicating artery aneurysm projects anterosuperiorly and is unchanged in size with a wide neck. There is an outpouching from the mid right M1 segment directed posteroinferiorly near the anterior temporal artery origin which measures 3 x 2 mm, compatible with a small aneurysm and unchanged. No significant MCA stenosis or major branch vessel occlusion is seen.  Posterior circulation: The visualized distal vertebral arteries are patent with the left being dominant. PICA, AICA, and SCA origins are patent. Basilar artery is patent without stenosis. There is a small left posterior communicating artery. PCAs are patent with mild branch vessel irregularity  bilaterally. The left P1 segment is mildly small in caliber diffusely, however this is much improved from the prior study.  Venous sinuses: Patent.  Anatomic variants: Absent right A1.  Delayed phase:No abnormal enhancement.  IMPRESSION: 1. No evidence of acute intracranial abnormality. 2. 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 3. Unchanged 3 mm right M1 MCA aneurysm. 4. Improved patency of the left P1 segment.

## 2015-09-08 NOTE — ED Notes (Signed)
Family at bedside. 

## 2015-09-08 NOTE — ED Notes (Signed)
MD at bedside. 

## 2015-09-08 NOTE — ED Notes (Signed)
C/o pain to left parietal area and left ear-pain started last night-states hx of same several years ago-was seen by PCP-dx with wax in ear and ear was irrigated-appt with PCP tomorrow-pt A/O-steady gait with own walker to triage

## 2015-09-20 DIAGNOSIS — H9202 Otalgia, left ear: Secondary | ICD-10-CM | POA: Diagnosis not present

## 2015-09-20 DIAGNOSIS — H6121 Impacted cerumen, right ear: Secondary | ICD-10-CM | POA: Diagnosis not present

## 2015-09-28 DIAGNOSIS — H401123 Primary open-angle glaucoma, left eye, severe stage: Secondary | ICD-10-CM | POA: Diagnosis not present

## 2015-09-28 DIAGNOSIS — H401113 Primary open-angle glaucoma, right eye, severe stage: Secondary | ICD-10-CM | POA: Diagnosis not present

## 2015-10-05 DIAGNOSIS — M26622 Arthralgia of left temporomandibular joint: Secondary | ICD-10-CM | POA: Diagnosis not present

## 2015-10-05 DIAGNOSIS — H9202 Otalgia, left ear: Secondary | ICD-10-CM | POA: Diagnosis not present

## 2015-10-05 DIAGNOSIS — M316 Other giant cell arteritis: Secondary | ICD-10-CM | POA: Diagnosis not present

## 2015-11-04 DIAGNOSIS — I10 Essential (primary) hypertension: Secondary | ICD-10-CM | POA: Diagnosis not present

## 2015-11-04 DIAGNOSIS — M816 Localized osteoporosis [Lequesne]: Secondary | ICD-10-CM | POA: Diagnosis not present

## 2015-11-09 ENCOUNTER — Other Ambulatory Visit: Payer: Self-pay | Admitting: Internal Medicine

## 2015-11-09 DIAGNOSIS — R945 Abnormal results of liver function studies: Secondary | ICD-10-CM

## 2015-11-09 DIAGNOSIS — I10 Essential (primary) hypertension: Secondary | ICD-10-CM | POA: Diagnosis not present

## 2015-11-09 DIAGNOSIS — K7689 Other specified diseases of liver: Secondary | ICD-10-CM | POA: Diagnosis not present

## 2015-11-09 DIAGNOSIS — R634 Abnormal weight loss: Secondary | ICD-10-CM | POA: Diagnosis not present

## 2015-11-09 DIAGNOSIS — R51 Headache: Secondary | ICD-10-CM | POA: Diagnosis not present

## 2015-11-18 ENCOUNTER — Ambulatory Visit
Admission: RE | Admit: 2015-11-18 | Discharge: 2015-11-18 | Disposition: A | Discharge status: Home / Self Care | Payer: Medicare Other | Source: Ambulatory Visit | Attending: Internal Medicine | Admitting: Internal Medicine

## 2015-11-18 DIAGNOSIS — K7689 Other specified diseases of liver: Secondary | ICD-10-CM | POA: Diagnosis not present

## 2015-11-18 DIAGNOSIS — K8020 Calculus of gallbladder without cholecystitis without obstruction: Secondary | ICD-10-CM | POA: Diagnosis not present

## 2015-11-18 DIAGNOSIS — R51 Headache: Secondary | ICD-10-CM | POA: Diagnosis not present

## 2015-11-18 DIAGNOSIS — R945 Abnormal results of liver function studies: Secondary | ICD-10-CM

## 2015-11-23 DIAGNOSIS — K7689 Other specified diseases of liver: Secondary | ICD-10-CM | POA: Diagnosis not present

## 2015-11-23 DIAGNOSIS — I10 Essential (primary) hypertension: Secondary | ICD-10-CM | POA: Diagnosis not present

## 2015-11-23 DIAGNOSIS — R51 Headache: Secondary | ICD-10-CM | POA: Diagnosis not present

## 2015-11-25 ENCOUNTER — Ambulatory Visit (INDEPENDENT_AMBULATORY_CARE_PROVIDER_SITE_OTHER): Payer: Medicare Other | Admitting: Neurology

## 2015-11-25 ENCOUNTER — Encounter: Payer: Self-pay | Admitting: Neurology

## 2015-11-25 VITALS — BP 100/58 | HR 54 | Ht <= 58 in | Wt 77.0 lb

## 2015-11-25 DIAGNOSIS — R51 Headache: Secondary | ICD-10-CM

## 2015-11-25 DIAGNOSIS — R519 Headache, unspecified: Secondary | ICD-10-CM | POA: Insufficient documentation

## 2015-11-25 DIAGNOSIS — M5481 Occipital neuralgia: Secondary | ICD-10-CM | POA: Diagnosis not present

## 2015-11-25 HISTORY — DX: Occipital neuralgia: M54.81

## 2015-11-25 HISTORY — DX: Headache, unspecified: R51.9

## 2015-11-25 MED ORDER — GABAPENTIN 100 MG PO CAPS: 100.0000 mg | ORAL_CAPSULE | Freq: Three times a day (TID) | ORAL | Status: DC

## 2015-11-25 MED ORDER — PREDNISONE 5 MG PO TABS: ORAL_TABLET | ORAL | Status: DC

## 2015-11-25 NOTE — Progress Notes (Signed)
Reason for visit: Headache  Referring physician: Dr. Secundino Alicia Gomez is a 82 y.o. female  History of present illness:  Alicia Gomez is an 82 year old right-handed white female with a history of a cerebral aneurysm that was found in 2012 affecting the anterior communicating artery. The patient indicates that she was doing well until around 09/08/2015 when she had onset of sharp pain coming up from the left occipital area going to the left parietal area of the head. The patient indicates that the pain will come and go, but it is daily in nature to some degree. The patient can worsen the pain by turning the head to the right. The patient had onset of the pain today while she was at the hairdresser and tilted her head back with onset of severe pain. The patient denies any visual disturbances, or numbness or weakness with the headache. She denies any nausea or vomiting. He does report some vertigo issues that have predated the headache. She takes meclizine on occasion for this. She has not had any change in her ability to ambulate. She denies any discomfort around the eyes., she has been using a walker for about one year. She denies a blackout episodes. She does report some decrease in appetite. The patient denies any blackouts or confusion. She has had a recent CT angiogram that showed good stability in the size of the anterior communicating artery aneurysm. She had blood work done, and she was told that she did not have temporal arteritis. She is sent to this office for an evaluation.  Past Medical History  Diagnosis Date  . Left ventricular dysfunction     NOW NORMALIZED  . Hypertension   . Dyslipidemia   . Osteoarthritis   . Atrial fibrillation ([place])     RESOLVED  . Kyphosis   . Osteoporosis   . Headache 11/25/2015    Left occipital  . Occipital neuralgia of left side 11/25/2015    Past Surgical History  Procedure Laterality Date  . Cataract extraction      Family History  Problem  Relation Age of Onset  . Heart disease Brother   . Hypertension Other     6 siblings died with heart disease    Social history:  reports that she has never smoked. She does not have any smokeless tobacco history on file. She reports that she does not drink alcohol or use illicit drugs.  Medications:  Prior to Admission medications   Medication Sig Start Date End Date Taking? Authorizing Provider  brimonidine (ALPHAGAN) 0.2 % ophthalmic solution  08/20/15  Yes Historical Provider, MD  acetaminophen (TYLENOL 8 HOUR) 650 MG CR tablet Take 1 tablet (650 mg total) by mouth every 8 (eight) hours as needed for pain. 09/08/15   Varney Biles, MD  aspirin 81 MG tablet Take 81 mg by mouth daily.      Historical Provider, MD  bimatoprost (LUMIGAN) 0.03 % ophthalmic drops Apply 1 drop to eye at bedtime.      Historical Provider, MD  carvedilol (COREG) 25 MG tablet Take 12.5 mg by mouth 2 (two) times daily with a meal.      Historical Provider, MD  Cholecalciferol (VITAMIN D PO) Take by mouth daily.      Historical Provider, MD  Cinnamon 500 MG TABS Take 1 tablet by mouth 1 day or 1 dose.    Historical Provider, MD  cyclobenzaprine (FLEXERIL) 10 MG tablet Take 10 mg by mouth 2 (two) times daily.  Historical Provider, MD  gabapentin (NEURONTIN) 100 MG capsule Take 100 mg by mouth daily. 1-2 DAILY     Historical Provider, MD  meclizine (ANTIVERT) 25 MG tablet Take 25 mg by mouth 2 (two) times daily. 1/2 tablets BID    Historical Provider, MD  simvastatin (ZOCOR) 20 MG tablet Take 20 mg by mouth at bedtime.  10/08/11   Historical Provider, MD  timolol (BETIMOL) 0.5 % ophthalmic solution Apply 1 drop to eye 1 dose over 46 hours.      Historical Provider, MD      Allergies  Allergen Reactions  . Codeine     ROS:  Out of a complete 14 system review of symptoms, the patient complains only of the following symptoms, and all other reviewed systems are negative.  Weight loss, fatigue Heart  murmur Headache Change in appetite  Blood pressure 100/58, pulse 54, height 4\' 2"  (1.27 m), weight 77 lb (34.927 kg).  Physical Exam  General: The patient is alert and cooperative at the time of the examination.  Eyes: Pupils are equal, round, and reactive to light. Discs are flat bilaterally.  Neck: The neck is supple, no carotid bruits are noted.  Respiratory: The respiratory examination is clear.  Cardiovascular: The cardiovascular examination reveals a regular rate and rhythm, no obvious murmurs or rubs are noted.  Neuromuscular: The patient appears to have significant kyphosis, flexed posture cervical spine, the patient will hold up the head with the left hand.  Skin: Extremities are without significant edema.  Neurologic Exam  Mental status: The patient is alert and oriented x 3 at the time of the examination. The patient has apparent normal recent and remote memory, with an apparently normal attention span and concentration ability.  Cranial nerves: Facial symmetry is present. There is good sensation of the face to pinprick and soft touch bilaterally. The strength of the facial muscles and the muscles to head turning and shoulder shrug are normal bilaterally. Speech is well enunciated, no aphasia or dysarthria is noted. Extraocular movements are full. Visual fields are full. The tongue is midline, and the patient has symmetric elevation of the soft palate. No obvious hearing deficits are noted.  Motor: The motor testing reveals 5 over 5 strength of all 4 extremities. Good symmetric motor tone is noted throughout.  Sensory: Sensory testing is intact to pinprick, soft touch, vibration sensation, and position sense on all 4 extremities. No evidence of extinction is noted.  Coordination: Cerebellar testing reveals good finger-nose-finger and heel-to-shin bilaterally.  Gait and station: Gait is slightly wide-based, the patient uses a walker for ambulation. Tandem gait was not  attempted. Romberg is positive, the patient goes backwards. No drift is seen.  Reflexes: Deep tendon reflexes are symmetric, but are depressed bilaterally. Toes are downgoing bilaterally.   Assessment/Plan:  1. Left occipital headache, probable occipital nerve mediated headache  2. Cervical spondylosis  3. Cerebral aneurysm  The patient likely has pain mediated through the left occipital nerve. This may be related to the cervical arthritis. The patient will be placed on a low dose prednisone Dosepak, 5 milligrams 6 day pack. She will be increased with the gabapentin dosing taking 100 mg 3 times daily. If the medications are not effective, and occipital nerve injection may be done in the future. She will follow-up in 2-3 months.  Jill Alexanders MD 11/25/2015 7:53 PM  [facility] [address] [place], [place] [address]  Phone [phone] Fax [phone]

## 2015-11-25 NOTE — Patient Instructions (Signed)
We will go up on the gabapentin taking 100 mg three times a day. We will use prednisone 5 mg for the next 6 days to help the pain.  Occipital Neuralgia Occipital neuralgia is a type of headache that causes episodes of very bad pain in the back of your head. Pain from occipital neuralgia may spread (radiate) to other parts of your head. The pain is usually brief and often goes away after you rest and relax. These headaches may be caused by irritation of the nerves that leave your spinal cord high up in your neck, just below the base of your skull (occipital nerves). Your occipital nerves transmit sensations from the back of your head, the top of your head, and the areas behind your ears. CAUSES Occipital neuralgia can occur without any known cause (primary headache syndrome). In other cases, occipital neuralgia is caused by pressure on or irritation of one of the two occipital nerves. Causes of occipital nerve compression or irritation include:  Wear and tear of the vertebrae in the neck (osteoarthritis).  Neck injury.  Disease of the disks that separate the vertebrae.  Tumors.  Gout.  Infections.  Diabetes.  Swollen blood vessels that put pressure on the occipital nerves.  Muscle spasm in the neck. SIGNS AND SYMPTOMS Pain is the main symptom of occipital neuralgia. It usually starts in the back of the head but may also be felt in other areas supplied by the occipital nerves. Pain is usually on one side but may be on both sides. You may have:   Brief episodes of very bad pain that is burning, stabbing, shocking, or shooting.  Pain behind the eye.  Pain triggered by neck movement or hair brushing.  Scalp tenderness.  Aching in the back of the head between episodes of very bad pain. DIAGNOSIS  Your health care provider may diagnose occipital neuralgia based on your symptoms and a physical exam. During the exam, the health care provider may push on areas supplied by the  occipital nerves to see if they are painful. Some tests may also be done to help in making the diagnosis. These may include:  Imaging studies of the upper spinal cord, such as an MRI or CT scan. These may show compression or spinal cord abnormalities.  Nerve block. You will get an injection of numbing medicine (local anesthetic) near the occipital nerve to see if this relieves pain. TREATMENT  Treatment may begin with simple measures, such as:   Rest.  Massage.  Heat.  Over-the-counter pain relievers. If these measures do not work, you may need other treatments, including:  Medicines such as:  Prescription-strength anti-inflammatory medicines.  Muscle relaxants.  Antiseizure medicines.  Antidepressants.  Steroid injection. This involves injections of local anesthetic and strong anti-inflammatory drugs (steroids).  Pulsed radiofrequency. Wires are implanted to deliver electrical impulses that block pain signals from the occipital nerve.  Physical therapy.  Surgery to relieve nerve pressure. HOME CARE INSTRUCTIONS  Take all medicines as directed by your health care provider.  Avoid activities that cause pain.  Rest when you have an attack of pain.  Try gentle massage or a heating pad to relieve pain.  Work with a physical therapist to learn stretching exercises you can do at home.  Try a different pillow or sleeping position.  Practice good posture.  Try to stay active. Get regular exercise that does not cause pain. Ask your health care provider to suggest safe exercises for you.  Keep all  follow-up visits as directed by your health care provider. This is important. SEEK MEDICAL CARE IF:  Your medicine is not working.  You have new or worsening symptoms. SEEK IMMEDIATE MEDICAL CARE IF:  You have very bad head pain that is not going away.  You have a sudden change in vision, balance, or speech. MAKE SURE YOU:  Understand these instructions.  Will watch  your condition.  Will get help right away if you are not doing well or get worse.   This information is not intended to replace advice given to you by your health care provider. Make sure you discuss any questions you have with your health care provider.   Document Released: 07/04/2001 Document Revised: 07/31/2014 Document Reviewed: 07/02/2013 Elsevier Interactive Patient Education [employer].

## 2015-12-06 ENCOUNTER — Telehealth: Payer: Self-pay | Admitting: Neurology

## 2015-12-06 NOTE — Telephone Encounter (Signed)
I called patient. I talk with the sister-in-law. The patient is still having ongoing headaches, I will get her in for an occipital nerve injection. The patient is also having sedation and gait instability on low-dose gabapentin taking 100 mg 3 times daily. She is to stop the medication.

## 2015-12-06 NOTE — Telephone Encounter (Signed)
Elmo Putt is calling and would like to bring the patient in for a possible shot in the head for migraines which was suggested by Dr. Jannifer Franklin. Please call her or her husband Colin Benton [phone] to set up an appointment. Thanks!

## 2015-12-07 NOTE — Telephone Encounter (Signed)
Returned [employer] and spoke to [employer]. Pt scheduled for injection this Friday @ 12:00.

## 2015-12-10 ENCOUNTER — Encounter: Payer: Self-pay | Admitting: Neurology

## 2015-12-10 ENCOUNTER — Ambulatory Visit (INDEPENDENT_AMBULATORY_CARE_PROVIDER_SITE_OTHER): Payer: Medicare Other | Admitting: Neurology

## 2015-12-10 ENCOUNTER — Other Ambulatory Visit: Payer: Self-pay | Admitting: Neurology

## 2015-12-10 VITALS — BP 108/60 | HR 68 | Wt 72.2 lb

## 2015-12-10 DIAGNOSIS — M5481 Occipital neuralgia: Secondary | ICD-10-CM | POA: Diagnosis not present

## 2015-12-10 MED ORDER — BETAMETHASONE SOD PHOS & ACET 6 (3-3) MG/ML IJ SUSP
9.0000 mg | Freq: Once | INTRAMUSCULAR | Status: AC
  Administered 2015-12-10: 9 mg via INTRAMUSCULAR

## 2015-12-10 NOTE — Progress Notes (Signed)
Please refer to occipital nerve injection protocol.

## 2015-12-10 NOTE — Progress Notes (Signed)
The gabapentin medication resulted in too much drowsiness, gait instability, this was discontinued.

## 2015-12-10 NOTE — Procedures (Signed)
   History: Alicia Gomez is an 82 year old patient with a history of left occipital sharp shooting pains lasting 4 or 5 minutes, the pains are quite severe, unresponsive to medication. She comes in for occipital nerve injections to help control the pain.    Bupivicaine injection/Betamethasone protocol for occipital neuralgia  Bupivacaine 0.5% and Betamethasone 30 mg/5cc 1:1 mixture was injected on the scalp on the left at several locations:  -On the occipital area of the head, 3 injections on the left, 1 cc per injection at the midpoint between the mastoid process and the occipital protuberance. 2 other injections were done one finger breadth from the initial injection, one at a 10 o'clock position and the other at a 2 o'clock position.  The patient tolerated the injections well, no complications of the procedure were noted. Injections were made with a 27-gauge needle.  Bupivicaine NDC number is [phone].  Lot number is [id number]. Expriation date is 05/24/2017.   Betamethasone NDC 0517-0720-01  Lot number is [id number]. Expiration date is August 2018.

## 2015-12-13 ENCOUNTER — Telehealth: Payer: Self-pay | Admitting: Neurology

## 2015-12-13 NOTE — Telephone Encounter (Signed)
Pt's nephew called said her head pain is better but it still hurts and she is wanting another injection. Please call

## 2015-12-13 NOTE — Telephone Encounter (Signed)
I called the patient, left a message, okay to come in around 4 PM tomorrow and I will do another occipital nerve injection.

## 2015-12-14 ENCOUNTER — Ambulatory Visit (INDEPENDENT_AMBULATORY_CARE_PROVIDER_SITE_OTHER): Payer: Medicare Other | Admitting: Neurology

## 2015-12-14 ENCOUNTER — Encounter: Payer: Self-pay | Admitting: Neurology

## 2015-12-14 VITALS — BP 167/90 | HR 86 | Resp 16 | Ht <= 58 in | Wt 77.0 lb

## 2015-12-14 DIAGNOSIS — M5481 Occipital neuralgia: Secondary | ICD-10-CM

## 2015-12-14 MED ORDER — BETAMETHASONE SOD PHOS & ACET 6 (3-3) MG/ML IJ SUSP
9.0000 mg | Freq: Once | INTRAMUSCULAR | Status: AC
  Administered 2015-12-14: 9 mg via INTRAMUSCULAR

## 2015-12-14 NOTE — Procedures (Signed)
     History: Alicia Gomez is an 82 year old patient with a problem with left occipital headaches. The patient comes in for occipital nerve injections. She is gaining some improvement after the first injection, she still has some discomfort.    Bupivicaine injection/Betamethasone protocol for occipital neuralgia  Bupivacaine 0.5% and Betamethasone 30 mg/5cc 1:1 mixture was injected on the scalp on the left at several locations:  -On the occipital area of the head, 3 injections on the left, 1 cc per injection at the midpoint between the mastoid process and the occipital protuberance. 2 other injections were done one finger breadth from the initial injection, one at a 10 o'clock position and the other at a 2 o'clock position.  The patient tolerated the injections well, no complications of the procedure were noted. Injections were made with a 27-gauge needle.  Bupivicaine NDC number is [phone].  Lot number is [id number]. Expriation date is 05/24/17.   Betamethasone NDC 0517-0720-01  Lot number is [id number]. Expiration date is 8/18.

## 2015-12-14 NOTE — Telephone Encounter (Signed)
I called and left nephew a message advising him that Dr. Jannifer Franklin called patient and can see her today at 4pm for additional injections. I asked him to call back if they cannot make appt.

## 2015-12-14 NOTE — Progress Notes (Signed)
Please refer to occipital nerve procedure note.

## 2015-12-29 ENCOUNTER — Encounter: Payer: Self-pay | Admitting: Cardiology

## 2015-12-29 ENCOUNTER — Ambulatory Visit (INDEPENDENT_AMBULATORY_CARE_PROVIDER_SITE_OTHER): Payer: Medicare Other | Admitting: Cardiology

## 2015-12-29 VITALS — BP 152/83 | HR 70 | Ht <= 58 in | Wt 76.8 lb

## 2015-12-29 DIAGNOSIS — I519 Heart disease, unspecified: Secondary | ICD-10-CM | POA: Diagnosis not present

## 2015-12-29 DIAGNOSIS — E785 Hyperlipidemia, unspecified: Secondary | ICD-10-CM

## 2015-12-29 DIAGNOSIS — I5022 Chronic systolic (congestive) heart failure: Secondary | ICD-10-CM | POA: Insufficient documentation

## 2015-12-29 DIAGNOSIS — I10 Essential (primary) hypertension: Secondary | ICD-10-CM | POA: Diagnosis not present

## 2015-12-29 HISTORY — DX: Chronic systolic (congestive) heart failure: I50.22

## 2015-12-29 NOTE — Progress Notes (Signed)
Alicia Gomez Date of Birth: Jun 07, 1926   History of Present Illness: Alicia Gomez is seen for  followup cardiomyopathy. She has a history of dilated cardiomyopathy diagnosed in 2008 with an ejection fraction at that time of 20-25%. With medical management her ejection fraction improved to 45-55% in 2009. She was unable to tolerate a ARB due to hypotension. She has been on chronic carvedilol therapy. She also has a remote history of atrial fibrillation without recurrence.  On followup today she is feeling very well.  No chest pain, SOB, or palpitations. No edema. She is sedentary and doesn't get out of the house very much due to her physical limitations. She does report that her appetite has decreased and she has lost 15 lbs in last year.   Current Outpatient Prescriptions on File Prior to Visit  Medication Sig Dispense Refill  . acetaminophen (TYLENOL 8 HOUR) 650 MG CR tablet Take 1 tablet (650 mg total) by mouth every 8 (eight) hours as needed for pain. 30 tablet 0  . aspirin 81 MG tablet Take 81 mg by mouth daily.      . bimatoprost (LUMIGAN) 0.03 % ophthalmic drops Apply 1 drop to eye at bedtime.      . brimonidine (ALPHAGAN) 0.2 % ophthalmic solution     . carvedilol (COREG) 25 MG tablet Take 12.5 mg by mouth 2 (two) times daily with a meal.      . Cholecalciferol (VITAMIN D PO) Take by mouth daily.      . Cinnamon 500 MG TABS Take 1 tablet by mouth 1 day or 1 dose.    . meclizine (ANTIVERT) 25 MG tablet Take 25 mg by mouth 2 (two) times daily. 1/2 tablets BID    . simvastatin (ZOCOR) 20 MG tablet Take 20 mg by mouth at bedtime.     . timolol (BETIMOL) 0.5 % ophthalmic solution Apply 1 drop to eye 1 dose over 46 hours.       No current facility-administered medications on file prior to visit.    Allergies  Allergen Reactions  . Codeine     Past Medical History  Diagnosis Date  . Left ventricular dysfunction     NOW NORMALIZED  . Hypertension   . Dyslipidemia   .  Osteoarthritis   . Atrial fibrillation ([place])     RESOLVED  . Kyphosis   . Osteoporosis   . Headache 11/25/2015    Left occipital  . Occipital neuralgia of left side 11/25/2015  . Chronic systolic CHF (congestive heart failure) ([place]) 12/29/2015  . Chronic systolic CHF (congestive heart failure) ([place]) 12/29/2015    Past Surgical History  Procedure Laterality Date  . Cataract extraction      History  Smoking status  . Never Smoker   Smokeless tobacco  . Not on file    History  Alcohol Use No    Family History  Problem Relation Age of Onset  . Heart disease Brother   . Hypertension Other     6 siblings died with heart disease    Review of Systems:   All other systems were reviewed and are negative.  Physical Exam: BP 152/83 mmHg  Pulse 70  Ht 4' (1.219 m)  Wt 76 lb 12.8 oz (34.836 kg)  BMI 23.44 kg/m2 She is a pleasant elderly white female in no acute distress. Her HEENT exam is unremarkable. She has no JVD or bruits. Lungs are clear. Cardiac exam reveals a regular rate and rhythm without gallop, murmur,  or click. She has marked kyphosis. She has no edema. Pedal pulses are good.  LABORATORY DATA: ECG demonstrates normal sinus rhythm with old anterior infarct. Rate 70.  No acute change. I have personally reviewed and interpreted this study.   Assessment / Plan: 1. Dilated cardiomyopathy. Well compensated. Continue carvedilol. Last EF 45-55%.  2. Remote history of atrial fibrillation without recurrence.  No change in therapy today  I will followup again in one year

## 2015-12-29 NOTE — Patient Instructions (Signed)
Continue your current therapy  I will see you in one year   

## 2016-01-04 DIAGNOSIS — H401133 Primary open-angle glaucoma, bilateral, severe stage: Secondary | ICD-10-CM | POA: Diagnosis not present

## 2016-02-23 DIAGNOSIS — I10 Essential (primary) hypertension: Secondary | ICD-10-CM | POA: Diagnosis not present

## 2016-02-28 ENCOUNTER — Other Ambulatory Visit: Payer: Self-pay | Admitting: Neurology

## 2016-03-01 ENCOUNTER — Ambulatory Visit: Payer: Medicare Other | Admitting: Adult Health

## 2016-03-19 ENCOUNTER — Other Ambulatory Visit: Payer: Self-pay | Admitting: Neurology

## 2016-04-14 DIAGNOSIS — H401113 Primary open-angle glaucoma, right eye, severe stage: Secondary | ICD-10-CM | POA: Diagnosis not present

## 2016-04-14 DIAGNOSIS — H401123 Primary open-angle glaucoma, left eye, severe stage: Secondary | ICD-10-CM | POA: Diagnosis not present

## 2016-04-24 DIAGNOSIS — I10 Essential (primary) hypertension: Secondary | ICD-10-CM | POA: Diagnosis not present

## 2016-04-24 DIAGNOSIS — E7800 Pure hypercholesterolemia, unspecified: Secondary | ICD-10-CM | POA: Diagnosis not present

## 2016-04-24 DIAGNOSIS — Z23 Encounter for immunization: Secondary | ICD-10-CM | POA: Diagnosis not present

## 2016-06-18 ENCOUNTER — Encounter (HOSPITAL_COMMUNITY): Payer: Self-pay | Admitting: Emergency Medicine

## 2016-06-18 ENCOUNTER — Inpatient Hospital Stay (HOSPITAL_COMMUNITY)
Admission: EM | Admit: 2016-06-18 | Discharge: 2016-06-21 | DRG: 378 | Disposition: A | Discharge status: Home / Self Care | Payer: Medicare Other | Attending: Internal Medicine | Admitting: Internal Medicine

## 2016-06-18 DIAGNOSIS — M40209 Unspecified kyphosis, site unspecified: Secondary | ICD-10-CM | POA: Diagnosis present

## 2016-06-18 DIAGNOSIS — D6959 Other secondary thrombocytopenia: Secondary | ICD-10-CM | POA: Diagnosis not present

## 2016-06-18 DIAGNOSIS — I10 Essential (primary) hypertension: Secondary | ICD-10-CM | POA: Diagnosis present

## 2016-06-18 DIAGNOSIS — M1990 Unspecified osteoarthritis, unspecified site: Secondary | ICD-10-CM | POA: Diagnosis not present

## 2016-06-18 DIAGNOSIS — Z885 Allergy status to narcotic agent status: Secondary | ICD-10-CM | POA: Diagnosis not present

## 2016-06-18 DIAGNOSIS — Z7982 Long term (current) use of aspirin: Secondary | ICD-10-CM | POA: Diagnosis not present

## 2016-06-18 DIAGNOSIS — M810 Age-related osteoporosis without current pathological fracture: Secondary | ICD-10-CM | POA: Diagnosis present

## 2016-06-18 DIAGNOSIS — I480 Paroxysmal atrial fibrillation: Secondary | ICD-10-CM | POA: Diagnosis present

## 2016-06-18 DIAGNOSIS — E785 Hyperlipidemia, unspecified: Secondary | ICD-10-CM | POA: Diagnosis not present

## 2016-06-18 DIAGNOSIS — Z79899 Other long term (current) drug therapy: Secondary | ICD-10-CM

## 2016-06-18 DIAGNOSIS — I4891 Unspecified atrial fibrillation: Secondary | ICD-10-CM | POA: Diagnosis present

## 2016-06-18 DIAGNOSIS — K922 Gastrointestinal hemorrhage, unspecified: Secondary | ICD-10-CM | POA: Diagnosis not present

## 2016-06-18 DIAGNOSIS — G629 Polyneuropathy, unspecified: Secondary | ICD-10-CM | POA: Diagnosis present

## 2016-06-18 DIAGNOSIS — D62 Acute posthemorrhagic anemia: Secondary | ICD-10-CM | POA: Diagnosis not present

## 2016-06-18 DIAGNOSIS — I110 Hypertensive heart disease with heart failure: Secondary | ICD-10-CM | POA: Diagnosis present

## 2016-06-18 DIAGNOSIS — I5022 Chronic systolic (congestive) heart failure: Secondary | ICD-10-CM | POA: Diagnosis present

## 2016-06-18 DIAGNOSIS — R739 Hyperglycemia, unspecified: Secondary | ICD-10-CM | POA: Diagnosis present

## 2016-06-18 DIAGNOSIS — E869 Volume depletion, unspecified: Secondary | ICD-10-CM | POA: Diagnosis not present

## 2016-06-18 DIAGNOSIS — R51 Headache: Secondary | ICD-10-CM | POA: Diagnosis present

## 2016-06-18 DIAGNOSIS — K5791 Diverticulosis of intestine, part unspecified, without perforation or abscess with bleeding: Principal | ICD-10-CM | POA: Diagnosis present

## 2016-06-18 DIAGNOSIS — R531 Weakness: Secondary | ICD-10-CM | POA: Diagnosis not present

## 2016-06-18 DIAGNOSIS — E871 Hypo-osmolality and hyponatremia: Secondary | ICD-10-CM

## 2016-06-18 DIAGNOSIS — D649 Anemia, unspecified: Secondary | ICD-10-CM | POA: Diagnosis not present

## 2016-06-18 DIAGNOSIS — Z8249 Family history of ischemic heart disease and other diseases of the circulatory system: Secondary | ICD-10-CM

## 2016-06-18 DIAGNOSIS — I509 Heart failure, unspecified: Secondary | ICD-10-CM | POA: Diagnosis not present

## 2016-06-18 DIAGNOSIS — K625 Hemorrhage of anus and rectum: Secondary | ICD-10-CM | POA: Diagnosis not present

## 2016-06-18 LAB — COMPREHENSIVE METABOLIC PANEL
ALK PHOS: 32 U/L — AB (ref 38–126)
ALT: 16 U/L (ref 14–54)
ANION GAP: 9 (ref 5–15)
AST: 29 U/L (ref 15–41)
Albumin: 4 g/dL (ref 3.5–5.0)
BILIRUBIN TOTAL: 0.3 mg/dL (ref 0.3–1.2)
BUN: 23 mg/dL — ABNORMAL HIGH (ref 6–20)
CALCIUM: 9.6 mg/dL (ref 8.9–10.3)
CO2: 25 mmol/L (ref 22–32)
Chloride: 98 mmol/L — ABNORMAL LOW (ref 101–111)
Creatinine, Ser: 0.93 mg/dL (ref 0.44–1.00)
GFR, EST NON AFRICAN AMERICAN: 53 mL/min — AB (ref >60)
GLUCOSE: 135 mg/dL — AB (ref 65–99)
POTASSIUM: 4.6 mmol/L (ref 3.5–5.1)
Sodium: 132 mmol/L — ABNORMAL LOW (ref 135–145)
TOTAL PROTEIN: 6.6 g/dL (ref 6.5–8.1)

## 2016-06-18 LAB — CBC
HCT: 25.3 % — ABNORMAL LOW (ref 36.0–46.0)
HEMATOCRIT: 33.4 % — AB (ref 36.0–46.0)
HEMATOCRIT: 27.1 % — AB (ref 36.0–46.0)
HEMOGLOBIN: 11.2 g/dL — AB (ref 12.0–15.0)
HEMOGLOBIN: 8.7 g/dL — AB (ref 12.0–15.0)
HEMOGLOBIN: 9.3 g/dL — AB (ref 12.0–15.0)
MCH: 30.7 pg (ref 26.0–34.0)
MCH: 31.2 pg (ref 26.0–34.0)
MCH: 30.7 pg (ref 26.0–34.0)
MCHC: 33.5 g/dL (ref 30.0–36.0)
MCHC: 34.4 g/dL (ref 30.0–36.0)
MCHC: 34.3 g/dL (ref 30.0–36.0)
MCV: 91.5 fL (ref 78.0–100.0)
MCV: 90.7 fL (ref 78.0–100.0)
MCV: 89.4 fL (ref 78.0–100.0)
Platelets: 284 10*3/uL (ref 150–400)
Platelets: 225 10*3/uL (ref 150–400)
Platelets: 201 10*3/uL (ref 150–400)
RBC: 3.65 MIL/uL — AB (ref 3.87–5.11)
RBC: 2.79 MIL/uL — AB (ref 3.87–5.11)
RBC: 3.03 MIL/uL — AB (ref 3.87–5.11)
RDW: 12.1 % (ref 11.5–15.5)
RDW: 12 % (ref 11.5–15.5)
RDW: 12.3 % (ref 11.5–15.5)
WBC: 6.8 10*3/uL (ref 4.0–10.5)
WBC: 5.7 10*3/uL (ref 4.0–10.5)
WBC: 5.4 10*3/uL (ref 4.0–10.5)

## 2016-06-18 LAB — I-STAT CHEM 8, ED
BUN: 21 mg/dL — ABNORMAL HIGH (ref 6–20)
CALCIUM ION: 1.06 mmol/L — AB (ref 1.15–1.40)
Chloride: 99 mmol/L — ABNORMAL LOW (ref 101–111)
Creatinine, Ser: 0.9 mg/dL (ref 0.44–1.00)
Glucose, Bld: 111 mg/dL — ABNORMAL HIGH (ref 65–99)
HCT: 24 % — ABNORMAL LOW (ref 36.0–46.0)
Hemoglobin: 8.2 g/dL — ABNORMAL LOW (ref 12.0–15.0)
Potassium: 4.1 mmol/L (ref 3.5–5.1)
SODIUM: 131 mmol/L — AB (ref 135–145)
TCO2: 20 mmol/L (ref 0–100)

## 2016-06-18 LAB — PREPARE RBC (CROSSMATCH)

## 2016-06-18 LAB — PROTIME-INR
INR: 0.99
PROTHROMBIN TIME: 13.1 s (ref 11.4–15.2)

## 2016-06-18 LAB — APTT: aPTT: 26 seconds (ref 24–36)

## 2016-06-18 LAB — ABO/RH: ABO/RH(D): A POS

## 2016-06-18 MED ORDER — ONDANSETRON HCL 4 MG PO TABS: 4.0000 mg | ORAL_TABLET | Freq: Four times a day (QID) | ORAL | Status: DC | PRN

## 2016-06-18 MED ORDER — LATANOPROST 0.005 % OP SOLN
1.0000 [drp] | Freq: Every day | OPHTHALMIC | Status: DC
  Administered 2016-06-18 – 2016-06-20 (×3): 1 [drp] via OPHTHALMIC
  Filled 2016-06-18 (×2): qty 2.5

## 2016-06-18 MED ORDER — POLYETHYLENE GLYCOL 3350 17 G PO PACK
17.0000 g | PACK | Freq: Two times a day (BID) | ORAL | Status: DC
  Administered 2016-06-18 – 2016-06-21 (×3): 17 g via ORAL
  Filled 2016-06-18 (×4): qty 1

## 2016-06-18 MED ORDER — ONDANSETRON HCL 4 MG/2ML IJ SOLN
4.0000 mg | Freq: Four times a day (QID) | INTRAMUSCULAR | Status: DC | PRN
  Filled 2016-06-18: qty 2

## 2016-06-18 MED ORDER — PANTOPRAZOLE SODIUM 40 MG IV SOLR
8.0000 mg/h | INTRAVENOUS | Status: DC
  Filled 2016-06-18: qty 80

## 2016-06-18 MED ORDER — GABAPENTIN 100 MG PO CAPS
100.0000 mg | ORAL_CAPSULE | Freq: Two times a day (BID) | ORAL | Status: DC
  Administered 2016-06-18 – 2016-06-21 (×6): 100 mg via ORAL
  Filled 2016-06-18 (×6): qty 1

## 2016-06-18 MED ORDER — SODIUM CHLORIDE 0.9 % IV SOLN
Freq: Once | INTRAVENOUS | Status: AC
  Administered 2016-06-18: 09:00:00 via INTRAVENOUS

## 2016-06-18 MED ORDER — OXYCODONE HCL 5 MG PO TABS: 5.0000 mg | ORAL_TABLET | ORAL | Status: DC | PRN

## 2016-06-18 MED ORDER — TIMOLOL MALEATE 0.5 % OP SOLN
1.0000 [drp] | Freq: Two times a day (BID) | OPHTHALMIC | Status: DC
  Administered 2016-06-18 – 2016-06-21 (×6): 1 [drp] via OPHTHALMIC
  Filled 2016-06-18: qty 5

## 2016-06-18 MED ORDER — SODIUM CHLORIDE 0.9% FLUSH
3.0000 mL | Freq: Two times a day (BID) | INTRAVENOUS | Status: DC
  Administered 2016-06-18 – 2016-06-21 (×7): 3 mL via INTRAVENOUS

## 2016-06-18 MED ORDER — SODIUM CHLORIDE 0.9 % IV SOLN: Freq: Once | INTRAVENOUS | Status: DC

## 2016-06-18 MED ORDER — SODIUM CHLORIDE 0.9 % IV SOLN: INTRAVENOUS | Status: DC

## 2016-06-18 MED ORDER — TIMOLOL HEMIHYDRATE 0.5 % OP SOLN: 1.0000 [drp] | Freq: Two times a day (BID) | OPHTHALMIC | Status: DC

## 2016-06-18 MED ORDER — LABETALOL HCL 5 MG/ML IV SOLN: 5.0000 mg | INTRAVENOUS | Status: DC | PRN

## 2016-06-18 MED ORDER — ACETAMINOPHEN 325 MG PO TABS
650.0000 mg | ORAL_TABLET | Freq: Four times a day (QID) | ORAL | Status: DC | PRN
  Administered 2016-06-18 – 2016-06-21 (×7): 650 mg via ORAL
  Filled 2016-06-18 (×7): qty 2

## 2016-06-18 MED ORDER — MECLIZINE HCL 25 MG PO TABS: 25.0000 mg | ORAL_TABLET | Freq: Two times a day (BID) | ORAL | Status: DC | PRN

## 2016-06-18 MED ORDER — PANTOPRAZOLE SODIUM 40 MG IV SOLR
80.0000 mg | Freq: Once | INTRAVENOUS | Status: AC
  Administered 2016-06-18: 80 mg via INTRAVENOUS
  Filled 2016-06-18: qty 80

## 2016-06-18 MED ORDER — ACETAMINOPHEN 650 MG RE SUPP: 650.0000 mg | Freq: Four times a day (QID) | RECTAL | Status: DC | PRN

## 2016-06-18 NOTE — Consult Note (Signed)
Alicia Gomez Consult Note  Referring Provider: No ref. provider found Primary Care Physician:  Alicia Gravel, MD Primary Gastroenterologist:  Dr.  Laurel Gomez Complaint: Rectal bleeding HPI: Alicia Gomez is an 82 y.o. white female  who presents with sudden onset of painless hematochezia last night through early morning of which she was incontinent. She felt fine before she went to bed. She denies any melena. She takes a baby aspirin a day. She thinks he's had a colonoscopy more than 10 years ago.  Past Medical History:  Diagnosis Date  . Atrial fibrillation ([place])    RESOLVED  . Chronic systolic CHF (congestive heart failure) ([place]) 12/29/2015  . Chronic systolic CHF (congestive heart failure) ([place]) 12/29/2015  . Dyslipidemia   . Headache 11/25/2015   Left occipital  . Hypertension   . Kyphosis   . Left ventricular dysfunction    NOW NORMALIZED  . Occipital neuralgia of left side 11/25/2015  . Osteoarthritis   . Osteoporosis     Past Surgical History:  Procedure Laterality Date  . CATARACT EXTRACTION       (Not in a hospital admission)  Allergies:  Allergies  Allergen Reactions  . Codeine Nausea And Vomiting    Family History  Problem Relation Age of Onset  . Heart disease Brother   . Hypertension Other     6 siblings died with heart disease    Social History:  reports that she has never smoked. She has never used smokeless tobacco. She reports that she does not drink alcohol or use drugs.  Review of Systems: negative except As above   Blood pressure (!) 117/50, pulse 64, temperature 97.6 F (36.4 C), resp. rate 18, height '4\' 1"'  (1.245 m), weight 38.6 kg (85 lb), SpO2 97 %. Head: Normocephalic, without obvious abnormality, atraumatic Neck: no adenopathy, no carotid bruit, no JVD, supple, symmetrical, trachea midline and thyroid not enlarged, symmetric, no tenderness/mass/nodules Resp: clear to auscultation bilaterally Cardio: regular rate and rhythm, S1, S2 normal,  no murmur, click, rub or gallop GI: Abdomen soft, scaphoid, with normoactive bowel sounds. No hepatosplenomegaly mass or guarding. Extremities: extremities normal, atraumatic, no cyanosis or edema  Results for orders placed or performed during the hospital encounter of 06/18/16 (from the past 48 hour(s))  Type and screen [place]     Status: None (Preliminary result)   Collection Time: 06/18/16  5:40 AM  Result Value Ref Range   ABO/RH(D) A POS    Antibody Screen PENDING    Sample Expiration 06/21/2016   ABO/Rh     Status: None (Preliminary result)   Collection Time: 06/18/16  5:40 AM  Result Value Ref Range   ABO/RH(D) A POS   Comprehensive metabolic panel     Status: Abnormal   Collection Time: 06/18/16  5:58 AM  Result Value Ref Range   Sodium 132 (L) 135 - 145 mmol/L   Potassium 4.6 3.5 - 5.1 mmol/L   Chloride 98 (L) 101 - 111 mmol/L   CO2 25 22 - 32 mmol/L   Glucose, Bld 135 (H) 65 - 99 mg/dL   BUN 23 (H) 6 - 20 mg/dL   Creatinine, Ser 0.93 0.44 - 1.00 mg/dL   Calcium 9.6 8.9 - 10.3 mg/dL   Total Protein 6.6 6.5 - 8.1 g/dL   Albumin 4.0 3.5 - 5.0 g/dL   AST 29 15 - 41 U/L   ALT 16 14 - 54 U/L   Alkaline Phosphatase 32 (L) 38 - 126 U/L  Total Bilirubin 0.3 0.3 - 1.2 mg/dL   GFR calc non Af Amer 53 (L) >60 mL/min   GFR calc Af Amer >60 >60 mL/min    Comment: (NOTE) The eGFR has been calculated using the CKD EPI equation. This calculation has not been validated in all clinical situations. eGFR's persistently <60 mL/min signify possible Chronic Kidney Disease.    Anion gap 9 5 - 15  CBC     Status: Abnormal   Collection Time: 06/18/16  5:58 AM  Result Value Ref Range   WBC 6.8 4.0 - 10.5 K/uL   RBC 3.65 (L) 3.87 - 5.11 MIL/uL   Hemoglobin 11.2 (L) 12.0 - 15.0 g/dL   HCT 33.4 (L) 36.0 - 46.0 %   MCV 91.5 78.0 - 100.0 fL   MCH 30.7 26.0 - 34.0 pg   MCHC 33.5 30.0 - 36.0 g/dL   RDW 12.1 11.5 - 15.5 %   Platelets 284 150 - 400 K/uL  Protime-INR      Status: None   Collection Time: 06/18/16  6:27 AM  Result Value Ref Range   Prothrombin Time 13.1 11.4 - 15.2 seconds   INR 0.99   APTT     Status: None   Collection Time: 06/18/16  6:27 AM  Result Value Ref Range   aPTT 26 24 - 36 seconds   No results found.  Assessment: Lower GI bleed, probably diverticular Plan:  Monitor stools and hemoglobin Reassess tomorrow. Clear liquid diet and 1 dose of Mira lax today. Consider flexible sigmoidoscopy this admission. Alicia Gomez C 06/18/2016, 7:18 AM  Pager [phone] If no answer or after 5 PM call [phone]

## 2016-06-18 NOTE — ED Provider Notes (Signed)
[facility] DEPT Provider Note   CSN: [id number] Arrival date & time: 06/18/16  [id number]     History   Chief Complaint Chief Complaint  Patient presents with  . Rectal Bleeding    HPI Alicia Gomez is a 82 y.o. female.  The history is provided by the patient and medical records.  Rectal Bleeding    82 y.o. F with hx of AFIB, CHF, dyslipidemia, headaches, HTN, kyphosis, osteoarthritis, presenting to the ED for rectal bleeding.  Patient states this began last night, not associated with bowel movement.  States she used the bathroom and it cleared up for a little while but woke up again and it was bleeding again.  States she started to feel weak so she woke her siblings up to bring her in.  She denies hx of GI bleeding in the past.  She had a colonoscopy about 5 years ago with Dr. Lajoyce Corners who is no longer in the area.  Denies any abdominal pain, nausea, vomiting.  No fever, chills recently.  States aside from bleeding she feels fine.  She takes baby ASA daily, no other anti-coagulation.  Past Medical History:  Diagnosis Date  . Atrial fibrillation ([place])    RESOLVED  . Chronic systolic CHF (congestive heart failure) ([place]) 12/29/2015  . Chronic systolic CHF (congestive heart failure) ([place]) 12/29/2015  . Dyslipidemia   . Headache 11/25/2015   Left occipital  . Hypertension   . Kyphosis   . Left ventricular dysfunction    NOW NORMALIZED  . Occipital neuralgia of left side 11/25/2015  . Osteoarthritis   . Osteoporosis     Patient Active Problem List   Diagnosis Date Noted  . Chronic systolic CHF (congestive heart failure) ([place]) 12/29/2015  . Headache 11/25/2015  . Occipital neuralgia of left side 11/25/2015  . Atrial fibrillation ([place])   . Left ventricular dysfunction   . Hypertension   . Dyslipidemia   . Osteoarthritis     Past Surgical History:  Procedure Laterality Date  . CATARACT EXTRACTION      OB History    No data available       Home Medications    Prior to  Admission medications   Medication Sig Start Date End Date Taking? Authorizing Provider  acetaminophen (TYLENOL 8 HOUR) 650 MG CR tablet Take 1 tablet (650 mg total) by mouth every 8 (eight) hours as needed for pain. 09/08/15   Varney Biles, MD  aspirin 81 MG tablet Take 81 mg by mouth daily.      Historical Provider, MD  bimatoprost (LUMIGAN) 0.03 % ophthalmic drops Apply 1 drop to eye at bedtime.      Historical Provider, MD  brimonidine (ALPHAGAN) 0.2 % ophthalmic solution  08/20/15   Historical Provider, MD  carvedilol (COREG) 25 MG tablet Take 12.5 mg by mouth 2 (two) times daily with a meal.      Historical Provider, MD  Cholecalciferol (VITAMIN D PO) Take by mouth daily.      Historical Provider, MD  Cinnamon 500 MG TABS Take 1 tablet by mouth 1 day or 1 dose.    Historical Provider, MD  meclizine (ANTIVERT) 25 MG tablet Take 25 mg by mouth 2 (two) times daily. 1/2 tablets BID    Historical Provider, MD  simvastatin (ZOCOR) 20 MG tablet Take 20 mg by mouth at bedtime.  10/08/11   Historical Provider, MD  timolol (BETIMOL) 0.5 % ophthalmic solution Apply 1 drop to eye 1 dose over 46 hours.  Historical Provider, MD    Family History Family History  Problem Relation Age of Onset  . Heart disease Brother   . Hypertension Other     6 siblings died with heart disease    Social History Social History  Substance Use Topics  . Smoking status: Never Smoker  . Smokeless tobacco: Never Used  . Alcohol use No     Allergies   Codeine   Review of Systems Review of Systems  Gastrointestinal: Positive for blood in stool and hematochezia.  All other systems reviewed and are negative.    Physical Exam Updated Vital Signs BP 129/69 (BP Location: Left Arm)   Pulse 65   Temp 97.6 F (36.4 C)   Resp 24   Ht 4\' 1"  (1.245 m)   Wt 38.6 kg   SpO2 99%   BMI 24.89 kg/m   Physical Exam  Constitutional: She is oriented to person, place, and time. She appears well-developed and  well-nourished.  Elderly, thin  HENT:  Head: Normocephalic and atraumatic.  Mouth/Throat: Oropharynx is clear and moist.  Eyes: Conjunctivae and EOM are normal. Pupils are equal, round, and reactive to light.  Neck: Normal range of motion.  Cardiovascular: Normal rate, regular rhythm and normal heart sounds.   Pulmonary/Chest: Effort normal and breath sounds normal.  Abdominal: Soft. Bowel sounds are normal.  Abdomen is soft, non-tender  Genitourinary: Rectal exam shows guaiac positive stool.  Genitourinary Comments: Slow ooze of gross melena on exam, small amount of dark red blood, some clots intermixed  Musculoskeletal: Normal range of motion.  Neurological: She is alert and oriented to person, place, and time.  Skin: Skin is warm and dry.  Psychiatric: She has a normal mood and affect.  Nursing note and vitals reviewed.    ED Treatments / Results  Labs (all labs ordered are listed, but only abnormal results are displayed) Labs Reviewed  COMPREHENSIVE METABOLIC PANEL - Abnormal; Notable for the following:       Result Value   Sodium 132 (*)    Chloride 98 (*)    Glucose, Bld 135 (*)    BUN 23 (*)    Alkaline Phosphatase 32 (*)    GFR calc non Af Amer 53 (*)    All other components within normal limits  CBC - Abnormal; Notable for the following:    RBC 3.65 (*)    Hemoglobin 11.2 (*)    HCT 33.4 (*)    All other components within normal limits  PROTIME-INR  APTT  POC OCCULT BLOOD, ED  TYPE AND SCREEN  ABO/RH    EKG  EKG Interpretation None       Radiology No results found.  Procedures Procedures (including critical care time)  CRITICAL CARE Performed by: Larene Pickett   Total critical care time: 40 minutes  Critical care time was exclusive of separately billable procedures and treating other patients.  Critical care was necessary to treat or prevent imminent or life-threatening deterioration.  Critical care was time spent personally by me on the  following activities: development of treatment plan with patient and/or surrogate as well as nursing, discussions with consultants, evaluation of patient's response to treatment, examination of patient, obtaining history from patient or surrogate, ordering and performing treatments and interventions, ordering and review of laboratory studies, ordering and review of radiographic studies, pulse oximetry and re-evaluation of patient's condition.   Medications Ordered in ED Medications  pantoprazole (PROTONIX) 80 mg in sodium chloride 0.9 % 100 mL IVPB (  80 mg Intravenous New Bag/Given 06/18/16 0651)     Initial Impression / Assessment and Plan / ED Course  I have reviewed the triage vital signs and the nursing notes.  Pertinent labs & imaging results that were available during my care of the patient were reviewed by me and considered in my medical decision making (see chart for details).  Clinical Course    82 y.o. F here with rectal bleeding.  Denies abdominal pain, nausea, vomiting.  She is afebrile, non-toxic on exam.  Abdomen is soft, benign.  She does have gross melena on exam, small amount of darker red blood with some clots.  Takes daily ASA only-- did skip her dose this morning.  No hx of GI bleeding in the past.  Saw Dr. Lajoyce Corners for colonoscopy who is no longer in the [place] region.  Labs with hgb of 11.2 (drop from 12.7 in February 2017).  She remains hemodynamically stable here.  protonix drip started.  Discussed with GI, Dr. Amedeo Plenty-- will see in consult later today.  Patient will be admitted to medicine service for ongoing care.  Final Clinical Impressions(s) / ED Diagnoses   Final diagnoses:  Acute GI bleeding    New Prescriptions New Prescriptions   No medications on file     Larene Pickett, PA-C 06/18/16 [place], PA-C [phone]    Delora Fuel, MD [phone]

## 2016-06-18 NOTE — ED Notes (Addendum)
Report to [place] rn, aware to hang drip protonix after bolus is completed.

## 2016-06-18 NOTE — ED Triage Notes (Signed)
Started approx 1030 pm last night and stated "I was just bleeding and I didn't want to wake my brother and sister up." Pt continued to use the bathroom and then it went away. PT states she went to lay down because she was weak and when she woke up she was still bleeding. SO she decided to come in. Pt denies blood thinner and does take a baby ASA daily.

## 2016-06-18 NOTE — ED Notes (Signed)
Cleaned of melena x 2

## 2016-06-18 NOTE — H&P (Signed)
History and Physical    Alicia Gomez [id number] DOB: Jun 30, 1926 DOA: 06/18/2016  PCP: Jani Gravel, MD Patient coming from: home  Chief Complaint: bleeding from bottom  HPI: Alicia Gomez is a 82 y.o. female with medical history significant of atrial fibrillation, CHF, headaches, HTN, severe kyphosis, presenting with multiple bouts of bright red blood per rectum. Patient states that at 20:30 on 06/17/2016 she developed bright red blood per rectum. Patient states that passage of blood came with little to no stool and was very bright red. Denies any melena or hematochezia area did occasional clot. Patient went to bed shortly after her initial bloody bowel movement but then woke up at approximately 04:30 with significant amount of blood throughout her clothing and bedding. Patient woke family members to bring her to the emergency room. Patient states that her only associated symptom is some generalized weakness. Denies any abdominal pain or cramping sensation, hematemesis, nausea, vomiting, dysuria, frequency, back pain, chest pain, shortness of breath, palpitations. Patient states that she followed the usual colonoscopy screening recommendations and they were always normal. Unsure when her last one was, but states it was performed by Dr. Lajoyce Corners who is now retired. Patient did start using 1 Aleve daily for the last 7 days. Denies any other recent NSAID use.   ED Course: The findings outlined below. Consulted GI for evaluation. Started on a PPI drip.  Review of Systems: As per HPI otherwise 10 point review of systems negative.   Ambulatory Status:uses a walker for ambulation   Past Medical History:  Diagnosis Date  . Atrial fibrillation ([place])    RESOLVED  . Chronic systolic CHF (congestive heart failure) ([place]) 12/29/2015  . Chronic systolic CHF (congestive heart failure) ([place]) 12/29/2015  . Dyslipidemia   . Headache 11/25/2015   Left occipital  . Hypertension   . Kyphosis   . Left ventricular  dysfunction    NOW NORMALIZED  . Occipital neuralgia of left side 11/25/2015  . Osteoarthritis   . Osteoporosis     Past Surgical History:  Procedure Laterality Date  . CATARACT EXTRACTION      Social History   Social History  . Marital status: Widowed    Spouse name: N/A  . Number of children: 0  . Years of education: N/A   Occupational History  . [employer]     retired   Social History Main Topics  . Smoking status: Never Smoker  . Smokeless tobacco: Never Used  . Alcohol use No  . Drug use: No  . Sexual activity: Not on file   Other Topics Concern  . Not on file   Social History Narrative   Lives at home alone   Right-handed   Usually drinks decaf coffee in the morning and occasional Coke       Allergies  Allergen Reactions  . Codeine Nausea And Vomiting    Family History  Problem Relation Age of Onset  . Heart disease Brother   . Hypertension Other     6 siblings died with heart disease    Prior to Admission medications   Medication Sig Start Date End Date Taking? Authorizing Provider  acetaminophen (TYLENOL 8 HOUR) 650 MG CR tablet Take 1 tablet (650 mg total) by mouth every 8 (eight) hours as needed for pain. 09/08/15  Yes Varney Biles, MD  aspirin 81 MG tablet Take 81 mg by mouth daily.     Yes Historical Provider, MD  bimatoprost (LUMIGAN) 0.03 % ophthalmic drops Place  1 drop into both eyes at bedtime.    Yes Historical Provider, MD  carvedilol (COREG) 25 MG tablet Take 12.5 mg by mouth 2 (two) times daily with a meal.     Yes Historical Provider, MD  Cholecalciferol (VITAMIN D PO) Take 1 tablet by mouth 2 (two) times daily.    Yes Historical Provider, MD  Cinnamon 500 MG TABS Take 1 tablet by mouth 1 day or 1 dose.   Yes Historical Provider, MD  gabapentin (NEURONTIN) 100 MG capsule Take 100 mg by mouth 2 (two) times daily.   Yes Historical Provider, MD  meclizine (ANTIVERT) 25 MG tablet Take 25 mg by mouth 2 (two) times daily.    Yes  Historical Provider, MD  timolol (BETIMOL) 0.5 % ophthalmic solution Place 1 drop into both eyes 2 (two) times daily.    Yes Historical Provider, MD    Physical Exam: Vitals:   06/18/16 0545 06/18/16 0615 06/18/16 0630 06/18/16 0700  BP: 125/65 110/61 106/64 (!) 117/50  Pulse: 71 64 67 64  Resp: 14 14 18 18   Temp:      SpO2: 98% 98% 96% 97%  Weight:      Height:         General:  Appears calm and comfortable Eyes:  PERRL, EOMI, normal lids, iris ENT:  grossly normal hearing, lips & tongue, mmm Neck:  no LAD, masses or thyromegaly Cardiovascular:  RRR, III/VI systolic murmur. No LE edema.  Respiratory:  CTA bilaterally, no w/r/r. Normal respiratory effort. Abdomen:  soft, ntnd, NABS Skin:  no rash or induration seen on limited exam Musculoskeletal:  Severe kyphosis, grossly normal tone BUE/BLE,  Psychiatric:  grossly normal mood and affect, speech fluent and appropriate, AOx3 Neurologic:  CN 2-12 grossly intact, moves all extremities in coordinated fashion, sensation intact  Labs on Admission: I have personally reviewed following labs and imaging studies  CBC:  Recent Labs Lab 06/18/16 0558  WBC 6.8  HGB 11.2*  HCT 33.4*  MCV 91.5  PLT [id number]   Basic Metabolic Panel:  Recent Labs Lab 06/18/16 0558  NA 132*  K 4.6  CL 98*  CO2 25  GLUCOSE 135*  BUN 23*  CREATININE 0.93  CALCIUM 9.6   GFR: Estimated Creatinine Clearance: 18.2 mL/min (by C-G formula based on SCr of 0.93 mg/dL). Liver Function Tests:  Recent Labs Lab 06/18/16 0558  AST 29  ALT 16  ALKPHOS 32*  BILITOT 0.3  PROT 6.6  ALBUMIN 4.0   No results for input(s): LIPASE, AMYLASE in the last 168 hours. No results for input(s): AMMONIA in the last 168 hours. Coagulation Profile:  Recent Labs Lab 06/18/16 0627  INR 0.99   Cardiac Enzymes: No results for input(s): CKTOTAL, CKMB, CKMBINDEX, TROPONINI in the last 168 hours. BNP (last 3 results) No results for input(s): PROBNP in the last  8760 hours. HbA1C: No results for input(s): HGBA1C in the last 72 hours. CBG: No results for input(s): GLUCAP in the last 168 hours. Lipid Profile: No results for input(s): CHOL, HDL, LDLCALC, TRIG, CHOLHDL, LDLDIRECT in the last 72 hours. Thyroid Function Tests: No results for input(s): TSH, T4TOTAL, FREET4, T3FREE, THYROIDAB in the last 72 hours. Anemia Panel: No results for input(s): VITAMINB12, FOLATE, FERRITIN, TIBC, IRON, RETICCTPCT in the last 72 hours. Urine analysis:    Component Value Date/Time   COLORURINE YELLOW 12/08/2010 1200   APPEARANCEUR CLEAR 12/08/2010 1200   LABSPEC 1.017 12/08/2010 1200   PHURINE 6.0 12/08/2010 1200   GLUCOSEU  NEGATIVE 12/08/2010 1200   HGBUR NEGATIVE 12/08/2010 1200   BILIRUBINUR NEGATIVE 12/08/2010 1200   KETONESUR 15 (A) 12/08/2010 1200   PROTEINUR NEGATIVE 12/08/2010 1200   UROBILINOGEN 0.2 12/08/2010 1200   NITRITE NEGATIVE 12/08/2010 1200   LEUKOCYTESUR  12/08/2010 1200    NEGATIVE MICROSCOPIC NOT DONE ON URINES WITH NEGATIVE PROTEIN, BLOOD, LEUKOCYTES, NITRITE, OR GLUCOSE <1000 mg/dL.    Creatinine Clearance: Estimated Creatinine Clearance: 18.2 mL/min (by C-G formula based on SCr of 0.93 mg/dL).  Sepsis Labs: @LABRCNTIP (procalcitonin:4,lacticidven:4) )No results found for this or any previous visit (from the past 240 hour(s)).   Radiological Exams on Admission: No results found.  [id number] Assessment/Plan Active Problems:   Atrial fibrillation (HCC)   Hypertension   Dyslipidemia   Chronic systolic CHF (congestive heart failure) (H[facility])   GI bleed   Symptomatic anemia     GI bleed: Likely lower GI bleed given bright red nature of blood, brisk venous of bleed, and absence of abdominal pain/discomfort. BUN/creatinine ratio is elevated cyst but suspect this is primarily due to dehydration. Minimal NSAID use reported (1 Aleve daily 7 days). Previous colonoscopy several years ago and reported as normal. No history of upper  endoscopy. Hemoglobin 11.2. Last known normal 12.7 - Nothing by mouth for likely colonoscopy - Evaluation by GI, Dr. Amedeo Plenty. Consultation by ENT. - DC Protonix drip - Nothing by mouth  Symptomatic anemia: Hemoglobin 11.2. Baseline to 0.7. Anticipate hemoglobin may be even lower given the fact that she is somewhat dehydrated and continues to bleed fairly briskly. Feeling fatigued. Continues to bleed. - CBC Q6 - Transfuse if hemoglobin drops below 8 and remained symptomatic  Systolic CHF: Chronic. Last echo from 2008 showing an EF of 20% with improvement to 45% 2009. Did not tolerate ARB. No evidence of decompensation - Echo - I/O, daily wts  Hyperglycemia: 168. No h/o DM - CBG checks - A1c  HTN: normotensive - hold coreg - Labetolol IV prn until taking po  Afib: "resolved years ago" per pt.  - EKG, telemetry - resume ASA 81 when bleeding stops.  - Coreg as above  BPV: intermittent. No recent episodes. - continue Antivert prn  Peripheral neuropathy: - continue neurontin    DVT prophylaxis: SCD  Code Status: full  Family Communication: daughter in law  Disposition Plan: pending eval by GI and resolution of bleed  Consults called: GI - Dr. Amedeo Plenty  Admission status: inpt    Fiza Nation J MD [facility]  If 7PM-7AM, please contact night-coverage [email or web address] Password [email or web address]  06/18/2016, 8:40 AM

## 2016-06-18 NOTE — Progress Notes (Signed)
New pt admission from ED. Pt brought to the floor in stable condition. Vitals taken. Initial Assessment done. All immediate pertinent needs to patient addressed. Patient Guide given to patient. Important safety instructions relating to hospitalization reviewed with patient. Patient verbalized understanding.pt is having blood in stool, it happened twice during the shift, 1 unit blood is transfused total from ED, 1 unit blood transfusion is due, report will be provided to the patient, will continue to monitor the patient.  Palma Holter, RN

## 2016-06-19 ENCOUNTER — Inpatient Hospital Stay (HOSPITAL_COMMUNITY): Payer: Medicare Other

## 2016-06-19 ENCOUNTER — Other Ambulatory Visit (HOSPITAL_COMMUNITY): Payer: Medicare Other

## 2016-06-19 ENCOUNTER — Encounter (HOSPITAL_COMMUNITY): Payer: Self-pay | Admitting: Radiology

## 2016-06-19 DIAGNOSIS — I509 Heart failure, unspecified: Secondary | ICD-10-CM

## 2016-06-19 DIAGNOSIS — E871 Hypo-osmolality and hyponatremia: Secondary | ICD-10-CM

## 2016-06-19 DIAGNOSIS — D62 Acute posthemorrhagic anemia: Secondary | ICD-10-CM

## 2016-06-19 HISTORY — PX: IR GENERIC HISTORICAL: IMG1180011

## 2016-06-19 LAB — CBC
HCT: 29.5 % — ABNORMAL LOW (ref 36.0–46.0)
Hemoglobin: 10.4 g/dL — ABNORMAL LOW (ref 12.0–15.0)
MCH: 30.6 pg (ref 26.0–34.0)
MCHC: 35.3 g/dL (ref 30.0–36.0)
MCV: 86.8 fL (ref 78.0–100.0)
PLATELETS: 162 10*3/uL (ref 150–400)
RBC: 3.4 MIL/uL — AB (ref 3.87–5.11)
RDW: 13.5 % (ref 11.5–15.5)
WBC: 5.6 10*3/uL (ref 4.0–10.5)

## 2016-06-19 LAB — BASIC METABOLIC PANEL
ANION GAP: 5 (ref 5–15)
BUN: 18 mg/dL (ref 6–20)
CALCIUM: 8.2 mg/dL — AB (ref 8.9–10.3)
CO2: 22 mmol/L (ref 22–32)
Chloride: 104 mmol/L (ref 101–111)
Creatinine, Ser: 0.82 mg/dL (ref 0.44–1.00)
Glucose, Bld: 106 mg/dL — ABNORMAL HIGH (ref 65–99)
POTASSIUM: 3.9 mmol/L (ref 3.5–5.1)
SODIUM: 131 mmol/L — AB (ref 135–145)

## 2016-06-19 MED ORDER — IOPAMIDOL (ISOVUE-300) INJECTION 61%
INTRAVENOUS | Status: AC
Start: 2016-06-19 — End: 2016-06-19
  Administered 2016-06-19: 100 mL
  Filled 2016-06-19: qty 150

## 2016-06-19 MED ORDER — MIDAZOLAM HCL 2 MG/2ML IJ SOLN
INTRAMUSCULAR | Status: AC
  Filled 2016-06-19: qty 2

## 2016-06-19 MED ORDER — FENTANYL CITRATE (PF) 100 MCG/2ML IJ SOLN
INTRAMUSCULAR | Status: AC | PRN
  Administered 2016-06-19: 25 ug via INTRAVENOUS
  Administered 2016-06-19: 12.5 ug via INTRAVENOUS
  Administered 2016-06-19: 25 ug via INTRAVENOUS

## 2016-06-19 MED ORDER — IOPAMIDOL (ISOVUE-300) INJECTION 61%
INTRAVENOUS | Status: AC
  Filled 2016-06-19: qty 50

## 2016-06-19 MED ORDER — FENTANYL CITRATE (PF) 100 MCG/2ML IJ SOLN
INTRAMUSCULAR | Status: AC
  Filled 2016-06-19: qty 2

## 2016-06-19 MED ORDER — SODIUM CHLORIDE 0.9 % IV BOLUS (SEPSIS)
250.0000 mL | Freq: Once | INTRAVENOUS | Status: AC
  Administered 2016-06-19: 250 mL via INTRAVENOUS

## 2016-06-19 MED ORDER — NALOXONE HCL 0.4 MG/ML IJ SOLN
INTRAMUSCULAR | Status: AC
  Filled 2016-06-19: qty 1

## 2016-06-19 MED ORDER — LIDOCAINE HCL 1 % IJ SOLN
INTRAMUSCULAR | Status: AC | PRN
  Administered 2016-06-19: 10 mL

## 2016-06-19 MED ORDER — IOPAMIDOL (ISOVUE-300) INJECTION 61%
INTRAVENOUS | Status: AC
Start: 2016-06-19 — End: 2016-06-19
  Administered 2016-06-19: 150 mL
  Filled 2016-06-19: qty 100

## 2016-06-19 MED ORDER — LIDOCAINE HCL 1 % IJ SOLN
INTRAMUSCULAR | Status: AC
  Filled 2016-06-19: qty 20

## 2016-06-19 MED ORDER — FLUMAZENIL 0.5 MG/5ML IV SOLN
INTRAVENOUS | Status: AC
  Filled 2016-06-19: qty 5

## 2016-06-19 MED ORDER — SODIUM CHLORIDE 0.9 % IV SOLN
INTRAVENOUS | Status: AC
  Administered 2016-06-19: 18:00:00 via INTRAVENOUS

## 2016-06-19 MED ORDER — TECHNETIUM TC 99M-LABELED RED BLOOD CELLS IV KIT
25.0000 | PACK | Freq: Once | INTRAVENOUS | Status: AC | PRN
  Administered 2016-06-19: 25 via INTRAVENOUS

## 2016-06-19 MED ORDER — SODIUM CHLORIDE 0.9 % IV SOLN
INTRAVENOUS | Status: AC | PRN
  Administered 2016-06-19: 500 mL via INTRAVENOUS

## 2016-06-19 MED ORDER — IOPAMIDOL (ISOVUE-300) INJECTION 61%
INTRAVENOUS | Status: AC
  Administered 2016-06-19: 20 mL
  Filled 2016-06-19: qty 50

## 2016-06-19 MED ORDER — MIDAZOLAM HCL 2 MG/2ML IJ SOLN
INTRAMUSCULAR | Status: AC | PRN
  Administered 2016-06-19 (×3): 0.5 mg via INTRAVENOUS

## 2016-06-19 NOTE — Sedation Documentation (Signed)
Pt does respond to voice. Pt denies pain at this time. MD aware of vital signs

## 2016-06-19 NOTE — Sedation Documentation (Signed)
Patient is resting comfortably. 

## 2016-06-19 NOTE — Sedation Documentation (Signed)
Patient denies pain and is resting comfortably.  

## 2016-06-19 NOTE — Sedation Documentation (Signed)
Pt does respond to voice. NS bolus infusing at this time.

## 2016-06-19 NOTE — Sedation Documentation (Signed)
Patient is resting comfortably. No complaints at this time 

## 2016-06-19 NOTE — Progress Notes (Signed)
PROGRESS NOTE  Alicia Gomez [id number] DOB: [birth year] DOA: 06/18/2016 PCP: Jani Gravel, MD  Brief History:  82 year old female with a history of systolic CHF, hypertension, hyperlipidemia, remote atrial fibrillation presented with painless hematochezia that began on 06/17/2016. On the morning of 06/18/2016, the patient woke up with blood on her clothing and bedding. As result, EMS was activated, the patient was brought to the hospital for further evaluation. The patient did not have any fevers, chills, abdominal pain, vomiting, dysuria, hematuria. Apparently, the patient had a colonoscopy over 10 years ago which showed diverticulosis. Upon presentation, the patient was noted have hemoglobin of 8.7.  [facility] was consulted to assist with management. The patient denies any long-term use of NSAIDs.  Assessment/Plan: Lower GI bleed -Likely secondary to diverticular bleed -Patient had another episode of hematochezia AM 06/19/2016 -Appreciate GI follow-up -06/18/16--NM RBC scan--GI bleed beginning in the left lower abdomen/ upper pelvis, thought to originate in the descending colon -will likely need IR for embolization -trend Hgb  Acute blood loss anemia -Baseline hemoglobin approximately 11-12 -Since the initial drop of hemoglobin, crit hemoglobin has remained largely stable -Patient remains hemodynamically stable  Chronic systolic CHF -Clinically euvolemic  HTN -stable -holding carvedilol  Hyponatremia -stable  PAF -"resolved years ago" per pt. -presently in sinus -holding ASA for now  Peripheral neuropathy: - continue neurontin  Hyperglycemia -check A1C     Disposition Plan:   Home in 2-3 days  Family Communication:   Family updated at bedside 11/27--Total time spent 35 minutes.  Greater than 50% spent face to face counseling and coordinating care.   Consultants:  [facility]  Code Status:  FULL   DVT Prophylaxis:  SCDs   Procedures: As Listed in  Progress Note Above  Antibiotics: None    Subjective: Patient denies fevers, chills, headache, chest pain, dyspnea, nausea, vomiting, diarrhea, abdominal pain, dysuria, hematuria, hematochezia, and melena.   Objective: Vitals:   06/18/16 2305 06/19/16 0126 06/19/16 0451 06/19/16 0904  BP: (!) 100/45 (!) 121/59 (!) 142/77 (!) 121/98  Pulse: 67 66 77 84  Resp: 18 20 18 18   Temp: [id number] F (36.3 C) 97.5 F (36.4 C) 97.6 F (36.4 C) 97.7 F (36.5 C)  TempSrc: Oral Oral Oral Oral  SpO2: 99% 97% 99% 100%  Weight:   39.9 kg (88 lb)   Height:        Intake/Output Summary (Last 24 hours) at 06/19/16 1044 Last data filed at 06/19/16 [id number]  Gross per 24 hour  Intake             1790 ml  Output                0 ml  Net             1790 ml   Weight change: 0.907 kg (2 lb) Exam:   General:  Pt is alert, follows commands appropriately, not in acute distress  HEENT: No icterus, No thrush, No neck mass, [place]/AT  Cardiovascular: RRR, S1/S2, no rubs, no gallops  Respiratory: bibasilar crackles, no wheeze  Abdomen: Soft/+BS, non tender, non distended, no guarding  Extremities: No edema, No lymphangitis, No petechiae, No rashes, no synovitis   Data Reviewed: I have personally reviewed following labs and imaging studies Basic Metabolic Panel:  Recent Labs Lab 06/18/16 0558 06/18/16 0943 06/19/16 0339  NA 132* 131* 131*  K 4.6 4.1 3.9  CL 98* 99* 104  CO2 25  --  22  GLUCOSE 135* 111* 106*  BUN 23* 21* 18  CREATININE 0.93 0.90 0.82  CALCIUM 9.6  --  8.2*   Liver Function Tests:  Recent Labs Lab 06/18/16 0558  AST 29  ALT 16  ALKPHOS 32*  BILITOT 0.3  PROT 6.6  ALBUMIN 4.0   No results for input(s): LIPASE, AMYLASE in the last 168 hours. No results for input(s): AMMONIA in the last 168 hours. Coagulation Profile:  Recent Labs Lab 06/18/16 0627  INR 0.99   CBC:  Recent Labs Lab 06/18/16 0558 06/18/16 0934 06/18/16 0943 06/18/16 1818 06/19/16 0339    WBC 6.8 5.7  --  5.4 5.6  HGB 11.2* 8.7* 8.2* 9.3* 10.4*  HCT 33.4* 25.3* 24.0* 27.1* 29.5*  MCV 91.5 90.7  --  89.4 86.8  PLT 284 225  --  201 162   Cardiac Enzymes: No results for input(s): CKTOTAL, CKMB, CKMBINDEX, TROPONINI in the last 168 hours. BNP: Invalid input(s): POCBNP CBG: No results for input(s): GLUCAP in the last 168 hours. HbA1C: No results for input(s): HGBA1C in the last 72 hours. Urine analysis:    Component Value Date/Time   COLORURINE YELLOW 12/08/2010 1200   APPEARANCEUR CLEAR 12/08/2010 1200   LABSPEC 1.017 12/08/2010 1200   PHURINE 6.0 12/08/2010 1200   GLUCOSEU NEGATIVE 12/08/2010 1200   HGBUR NEGATIVE 12/08/2010 1200   BILIRUBINUR NEGATIVE 12/08/2010 1200   KETONESUR 15 (A) 12/08/2010 1200   PROTEINUR NEGATIVE 12/08/2010 1200   UROBILINOGEN 0.2 12/08/2010 1200   NITRITE NEGATIVE 12/08/2010 1200   LEUKOCYTESUR  12/08/2010 1200    NEGATIVE MICROSCOPIC NOT DONE ON URINES WITH NEGATIVE PROTEIN, BLOOD, LEUKOCYTES, NITRITE, OR GLUCOSE <1000 mg/dL.   Sepsis Labs: @LABRCNTIP (procalcitonin:4,lacticidven:4) )No results found for this or any previous visit (from the past 240 hour(s)).   Scheduled Meds: . sodium chloride   Intravenous Once  . gabapentin  100 mg Oral BID  . latanoprost  1 drop Both Eyes QHS  . polyethylene glycol  17 g Oral BID  . sodium chloride flush  3 mL Intravenous Q12H  . timolol  1 drop Both Eyes BID   Continuous Infusions: . sodium chloride 50 mL/hr at 06/18/16 1615    Procedures/Studies: No results found.  Jaz Laningham, DO  [facility] Pager [phone]  If 7PM-7AM, please contact night-coverage [email or web address] Password [email or web address] 06/19/2016, 10:44 AM   LOS: 1 day

## 2016-06-19 NOTE — Progress Notes (Signed)
Called to bs, pt trying to get oob, reoriented to NM and testing area, offer support VS assessed no changes.  Encourage rest and continue with exam

## 2016-06-19 NOTE — Procedures (Signed)
Interventional Radiology Procedure Note  Procedure: RCFA puncture, Mesenteric angiogram of SMA & IMA, selective angiogram of superior rectal branches, coil embo of abnormal branch from the superior rectal.  Complications: None Recommendations:  - Right Leg straight for 4 hours - IV hydration for renal protection, 125cc/hr x 6 hours - Serial H&H - Do not submerge for 7 days - Routine wound care   Signed,  Dulcy Fanny. Earleen Newport, DO

## 2016-06-19 NOTE — Progress Notes (Signed)
Radiology called with verbal results of GI blood loss study. Dr. Penelope Coop paged with results. Will continue to monitor.

## 2016-06-19 NOTE — Sedation Documentation (Signed)
500 ML NS Bolus given at this time. MD aware of drop in BP

## 2016-06-19 NOTE — Sedation Documentation (Signed)
Pt is resting at this time with no complaints. Pt vitals are stable.

## 2016-06-19 NOTE — Progress Notes (Signed)
GI bleed scan results called and I have consulted IR for angiogram and embolization.

## 2016-06-19 NOTE — Sedation Documentation (Signed)
Increased  NS flow.

## 2016-06-19 NOTE — Sedation Documentation (Signed)
Pt is agitated by the oxygen tubing in her nose, she is trying to remove it.

## 2016-06-19 NOTE — Sedation Documentation (Signed)
Pt denies pain. Pt states, "I am tired of laying flat. I feel like I am bleeding."

## 2016-06-19 NOTE — Progress Notes (Signed)
Chief Complaint: Patient was seen in consultation today for GI bleed at the request of Dr. Anson Fret  Referring Physician(s): Dr. Anson Fret  Supervising Physician: Corrie Mckusick  Patient Status: [facility] - In-pt  History of Present Illness: Alicia Gomez is a 82 y.o. female admitted with lower GI bleed. Has never had GI bleed in the past. Apparent hx of uncomplicated diverticulosis. No imaging in system Has had ongoing bleeding and several episodes of hypotension, including now. Has had to have PRBC transfusion. GI consult, NM RBC scan ordered, positive for left abdomen IR has been consulted to eval for angiogram and possible embolization. Pt seen at bedside, feels weak. States she just had another bloody BM Chart, meds, labs reviewed. Nephew(POA) at bedside.   Past Medical History:  Diagnosis Date  . Atrial fibrillation ([place])    RESOLVED  . Chronic systolic CHF (congestive heart failure) ([place]) 12/29/2015  . Chronic systolic CHF (congestive heart failure) ([place]) 12/29/2015  . Dyslipidemia   . Headache 11/25/2015   Left occipital  . Hypertension   . Kyphosis   . Left ventricular dysfunction    NOW NORMALIZED  . Occipital neuralgia of left side 11/25/2015  . Osteoarthritis   . Osteoporosis     Past Surgical History:  Procedure Laterality Date  . CATARACT EXTRACTION      Allergies: Codeine  Medications:  Current Facility-Administered Medications:  .  iopamidol (ISOVUE-300) 61 % injection, , , ,  .  iopamidol (ISOVUE-300) 61 % injection, , , ,  .  lidocaine (XYLOCAINE) 1 % (with pres) injection, , , ,  .  0.9 %  sodium chloride infusion, , Intravenous, Continuous, Waldemar Dickens, MD, Last Rate: 50 mL/hr at 06/18/16 1615 .  0.9 %  sodium chloride infusion, , Intravenous, Once, Waldemar Dickens, MD, Stopped at 06/18/16 1407 .  acetaminophen (TYLENOL) tablet 650 mg, 650 mg, Oral, Q6H PRN, 650 mg at 06/19/16 [id number] **OR** acetaminophen (TYLENOL) suppository 650 mg, 650 mg,  Rectal, Q6H PRN, Waldemar Dickens, MD .  gabapentin (NEURONTIN) capsule 100 mg, 100 mg, Oral, BID, Waldemar Dickens, MD, 100 mg at 06/19/16 1000 .  labetalol (NORMODYNE,TRANDATE) injection 5 mg, 5 mg, Intravenous, Q2H PRN, Waldemar Dickens, MD .  latanoprost (XALATAN) 0.005 % ophthalmic solution 1 drop, 1 drop, Both Eyes, QHS, Waldemar Dickens, MD, 1 drop at 06/18/16 2154 .  meclizine (ANTIVERT) tablet 25 mg, 25 mg, Oral, BID PRN, Waldemar Dickens, MD .  ondansetron [facility]) tablet 4 mg, 4 mg, Oral, Q6H PRN **OR** ondansetron (ZOFRAN) injection 4 mg, 4 mg, Intravenous, Q6H PRN, Waldemar Dickens, MD .  oxyCODONE (Oxy IR/ROXICODONE) immediate release tablet 5 mg, 5 mg, Oral, Q4H PRN, Waldemar Dickens, MD .  polyethylene glycol (MIRALAX / GLYCOLAX) packet 17 g, 17 g, Oral, BID, Teena Irani, MD, 17 g at 06/18/16 2135 .  sodium chloride flush (NS) 0.9 % injection 3 mL, 3 mL, Intravenous, Q12H, Waldemar Dickens, MD, 3 mL at 06/19/16 1000 .  timolol (TIMOPTIC) 0.5 % ophthalmic solution 1 drop, 1 drop, Both Eyes, BID, Waldemar Dickens, MD, 1 drop at 06/19/16 1000    Family History  Problem Relation Age of Onset  . Heart disease Brother   . Hypertension Other     6 siblings died with heart disease    Social History   Social History  . Marital status: Widowed    Spouse name: N/A  . Number of children: 0  .  Years of education: N/A   Occupational History  . [employer]     retired   Social History Main Topics  . Smoking status: Never Smoker  . Smokeless tobacco: Never Used  . Alcohol use No  . Drug use: No  . Sexual activity: Not Asked   Other Topics Concern  . None   Social History Narrative   Lives at home alone   Right-handed   Usually drinks decaf coffee in the morning and occasional Coke       Review of Systems: A 12 point ROS discussed and pertinent positives are indicated in the HPI above.  All other systems are negative.  Review of Systems  Vital Signs: BP (!) 94/55 (BP  Location: Left Arm)   Pulse 87   Temp 97.7 F (36.5 C) (Oral)   Resp 18   Ht 4\' 2"  (1.27 m)   Wt 88 lb (39.9 kg)   SpO2 100%   BMI 24.75 kg/m   Physical Exam  Constitutional: She is oriented to person, place, and time. She appears well-developed. No distress.  HENT:  Head: Normocephalic.  Mouth/Throat: Oropharynx is clear and moist.  Neck: Normal range of motion. No tracheal deviation present.  Cardiovascular: Normal rate, regular rhythm, normal heart sounds and intact distal pulses.   Pulmonary/Chest: Effort normal and breath sounds normal.  Abdominal: Soft. She exhibits no mass. There is no tenderness. There is no guarding.  Neurological: She is alert and oriented to person, place, and time.  Psychiatric: She has a normal mood and affect.    Mallampati Score:  MD Evaluation Airway: WNL Heart: WNL Abdomen: WNL Chest/ Lungs: WNL ASA  Classification: 3 Mallampati/Airway Score: One  Imaging: Nm Gi Blood Loss  Result Date: 06/19/2016 CLINICAL DATA:  GI bleed. EXAM: NUCLEAR MEDICINE GASTROINTESTINAL BLEEDING SCAN TECHNIQUE: Sequential abdominal images were obtained following intravenous administration of Tc-55m labeled red blood cells. RADIOPHARMACEUTICALS:  25.7 mCi Tc-61m in-vitro labeled red cells. COMPARISON:  None. FINDINGS: The study is positive for a GI bleed beginning in the left lower abdomen/ upper pelvis. The patient was very agitated with significant motion precluding further imaging. However, the bleed appears to originate in the descending colon. IMPRESSION: GI bleed beginning in the left lower abdomen/ upper pelvis, thought to originate in the descending colon. These results will be called to the ordering clinician or representative by the Radiologist Assistant, and communication documented in the PACS or zVision Dashboard. Electronically Signed   By: Dorise Bullion III M.D   On: 06/19/2016 13:00    Labs:  CBC:  Recent Labs  06/18/16 0558 06/18/16 0934  06/18/16 0943 06/18/16 1818 06/19/16 0339  WBC 6.8 5.7  --  5.4 5.6  HGB 11.2* 8.7* 8.2* 9.3* 10.4*  HCT 33.4* 25.3* 24.0* 27.1* 29.5*  PLT 284 225  --  201 162    COAGS:  Recent Labs  06/18/16 0627  INR 0.99  APTT 26    BMP:  Recent Labs  09/08/15 1255 06/18/16 0558 06/18/16 0943 06/19/16 0339  NA 135 132* 131* 131*  K 4.5 4.6 4.1 3.9  CL 97* 98* 99* 104  CO2 27 25  --  22  GLUCOSE 119* 135* 111* 106*  BUN 12 23* 21* 18  CALCIUM 9.8 9.6  --  8.2*  CREATININE 0.91 0.93 0.90 0.82  GFRNONAA 55* 53*  --  >60  GFRAA >60 >60  --  >60    LIVER FUNCTION TESTS:  Recent Labs  06/18/16 0558  BILITOT 0.3  AST 29  ALT 16  ALKPHOS 32*  PROT 6.6  ALBUMIN 4.0    TUMOR MARKERS: No results for input(s): AFPTM, CEA, CA199, CHROMGRNA in the last 8760 hours.  Assessment and Plan: Lower GI bleed, NM PRBC scan suggests left abd Given ongoing bleeding and recent drop in BP, will proceed with mesenteric angio and possible embolization Labs reviewed. Risks and Benefits discussed with the patient including, but not limited to bleeding, infection, vascular injury or contrast induced renal failure. All of the patient's questions were answered, patient is agreeable to proceed. Consent signed and in chart.  Thank you for this interesting consult.  I greatly enjoyed meeting Alicia Gomez and look forward to participating in their care.  A copy of this report was sent to the requesting provider on this date.  Electronically Signed: Ascencion Dike 06/19/2016, 3:24 PM   I spent a total of 20 minutes in face to face in clinical consultation, greater than 50% of which was counseling/coordinating care for GI bleed/angiogram

## 2016-06-19 NOTE — Sedation Documentation (Signed)
Vitals are stable at this time.

## 2016-06-19 NOTE — Progress Notes (Addendum)
[facility] Progress Note  Subjective: The patient had another bloody bowel movement this morning. I spoke to the office where Dr. Lajoyce Corners used to work and colonoscopies in the past showed diverticulosis per report.  Objective: Vital signs in last 24 hours: Temp:  [97 F (36.1 C)-98.2 F (36.8 C)] 97.7 F (36.5 C) (11/27 0904) Pulse Rate:  [64-84] 84 (11/27 0904) Resp:  [13-27] 18 (11/27 0904) BP: (93-148)/(45-98) 121/98 (11/27 0904) SpO2:  [97 %-100 %] 100 % (11/27 0904) Weight:  [39.5 kg (87 lb)-39.9 kg (88 lb)] 39.9 kg (88 lb) (11/27 0451) Weight change: 0.907 kg (2 lb)   PE:  No distress  Heart regular rhythm  Lungs clear  Abdomen soft and nontender  Lab Results: Results for orders placed or performed during the hospital encounter of 06/18/16 (from the past 24 hour(s))  I-Stat Chem 8, ED     Status: Abnormal   Collection Time: 06/18/16  9:43 AM  Result Value Ref Range   Sodium 131 (L) 135 - 145 mmol/L   Potassium 4.1 3.5 - 5.1 mmol/L   Chloride 99 (L) 101 - 111 mmol/L   BUN 21 (H) 6 - 20 mg/dL   Creatinine, Ser 0.90 0.44 - 1.00 mg/dL   Glucose, Bld 111 (H) 65 - 99 mg/dL   Calcium, Ion 1.06 (L) 1.15 - 1.40 mmol/L   TCO2 20 0 - 100 mmol/L   Hemoglobin 8.2 (L) 12.0 - 15.0 g/dL   HCT 24.0 (L) 36.0 - 46.0 %  Prepare RBC     Status: None   Collection Time: 06/18/16 12:12 PM  Result Value Ref Range   Order Confirmation ORDER PROCESSED BY BLOOD BANK   CBC     Status: Abnormal   Collection Time: 06/18/16  6:18 PM  Result Value Ref Range   WBC 5.4 4.0 - 10.5 K/uL   RBC 3.03 (L) 3.87 - 5.11 MIL/uL   Hemoglobin 9.3 (L) 12.0 - 15.0 g/dL   HCT 27.1 (L) 36.0 - 46.0 %   MCV 89.4 78.0 - 100.0 fL   MCH 30.7 26.0 - 34.0 pg   MCHC 34.3 30.0 - 36.0 g/dL   RDW 12.3 11.5 - 15.5 %   Platelets 201 150 - 400 K/uL  CBC     Status: Abnormal   Collection Time: 06/19/16  3:39 AM  Result Value Ref Range   WBC 5.6 4.0 - 10.5 K/uL   RBC 3.40 (L) 3.87 - 5.11 MIL/uL   Hemoglobin 10.4 (L) 12.0 - 15.0 g/dL   HCT 29.5 (L) 36.0 - 46.0 %   MCV 86.8 78.0 - 100.0 fL   MCH 30.6 26.0 - 34.0 pg   MCHC 35.3 30.0 - 36.0 g/dL   RDW 13.5 11.5 - 15.5 %   Platelets 162 150 - 400 K/uL  Basic metabolic panel     Status: Abnormal   Collection Time: 06/19/16  3:39 AM  Result Value Ref Range   Sodium 131 (L) 135 - 145 mmol/L   Potassium 3.9 3.5 - 5.1 mmol/L   Chloride 104 101 - 111 mmol/L   CO2 22 22 - 32 mmol/L   Glucose, Bld 106 (H) 65 - 99 mg/dL   BUN 18 6 - 20 mg/dL   Creatinine, Ser 0.82 0.44 - 1.00 mg/dL   Calcium 8.2 (L) 8.9 - 10.3 mg/dL   GFR calc non Af Amer >60 >60 mL/min   GFR calc Af Amer >60 >60 mL/min   Anion gap 5 5 - 15  Studies/Results: No results found.    Assessment: Lower GI bleed. Most likely diverticular in origin Acute blood loss anemia  Plan:   Since she had further bleeding this morning I will order a nuclear medicine GI bleeding scan. If it is positive I would have interventional radiology see her for arteriogram with embolization.    Cassell Clement 06/19/2016, 9:42 AM  Pager: [phone] If no answer or after 5 PM call [phone]

## 2016-06-20 ENCOUNTER — Inpatient Hospital Stay (HOSPITAL_COMMUNITY): Payer: Medicare Other

## 2016-06-20 DIAGNOSIS — D649 Anemia, unspecified: Secondary | ICD-10-CM

## 2016-06-20 DIAGNOSIS — E871 Hypo-osmolality and hyponatremia: Secondary | ICD-10-CM

## 2016-06-20 DIAGNOSIS — I4891 Unspecified atrial fibrillation: Secondary | ICD-10-CM

## 2016-06-20 DIAGNOSIS — K922 Gastrointestinal hemorrhage, unspecified: Secondary | ICD-10-CM

## 2016-06-20 LAB — CBC
HEMATOCRIT: 17.2 % — AB (ref 36.0–46.0)
HEMOGLOBIN: 6 g/dL — AB (ref 12.0–15.0)
MCH: 30.6 pg (ref 26.0–34.0)
MCHC: 34.9 g/dL (ref 30.0–36.0)
MCV: 87.8 fL (ref 78.0–100.0)
Platelets: 139 10*3/uL — ABNORMAL LOW (ref 150–400)
RBC: 1.96 MIL/uL — AB (ref 3.87–5.11)
RDW: 14.3 % (ref 11.5–15.5)
WBC: 11.2 10*3/uL — AB (ref 4.0–10.5)

## 2016-06-20 LAB — BASIC METABOLIC PANEL
ANION GAP: 9 (ref 5–15)
BUN: 21 mg/dL — ABNORMAL HIGH (ref 6–20)
CALCIUM: 7.8 mg/dL — AB (ref 8.9–10.3)
CHLORIDE: 109 mmol/L (ref 101–111)
CO2: 17 mmol/L — AB (ref 22–32)
Creatinine, Ser: 0.81 mg/dL (ref 0.44–1.00)
GFR calc non Af Amer: >60 mL/min (ref >60)
Glucose, Bld: 110 mg/dL — ABNORMAL HIGH (ref 65–99)
Potassium: 4 mmol/L (ref 3.5–5.1)
Sodium: 135 mmol/L (ref 135–145)

## 2016-06-20 LAB — ECHOCARDIOGRAM COMPLETE
CHL CUP MV DEC (S): 169
EERAT: 12.76
EWDT: 169 ms
FS: 24 % — AB (ref 28–44)
HEIGHTINCHES: 50 in
IVS/LV PW RATIO, ED: 1.13
LA vol index: 47.2 mL/m2
LA vol: 54.3 mL
LAVOLA4C: 50.2 mL
LDCA: 2.54 cm2
LV E/e' medial: 12.76
LV E/e'average: 12.76
LVELAT: 7.6 cm/s
LVOTD: 18 mm
MV pk A vel: 103 m/s
MVPG: 4 mmHg
MVPKEVEL: 97 m/s
PW: 5.63 mm — AB (ref 0.6–1.1)
RV LATERAL S' VELOCITY: 8.4 cm/s
RV TAPSE: 16.8 mm
TDI e' lateral: 7.6
TDI e' medial: 5.96
WEIGHTICAEL: 1408 [oz_av]

## 2016-06-20 LAB — PREPARE RBC (CROSSMATCH)

## 2016-06-20 MED ORDER — SODIUM CHLORIDE 0.9 % IV SOLN
Freq: Once | INTRAVENOUS | Status: AC
  Administered 2016-06-20: 07:00:00 via INTRAVENOUS

## 2016-06-20 MED ORDER — SALINE SPRAY 0.65 % NA SOLN
1.0000 | NASAL | Status: DC | PRN
  Filled 2016-06-20: qty 44

## 2016-06-20 NOTE — Progress Notes (Signed)
[facility] Progress Note  Subjective: Doing well this morning. No report of bleeding at this time. Angiogram done yesterday with embolization.  Objective: Vital signs in last 24 hours: Temp:  [97.7 F (36.5 C)-98.5 F (36.9 C)] 98.5 F (36.9 C) (11/28 0729) Pulse Rate:  [68-105] 82 (11/28 0729) Resp:  [12-34] 20 (11/28 0729) BP: (60-153)/(39-98) 97/42 (11/28 0729) SpO2:  [94 %-100 %] 98 % (11/28 0729) Weight change:    PE: No distress Heart RRR Lungs clear Abdomen soft, not tender  Lab Results: Results for orders placed or performed during the hospital encounter of 06/18/16 (from the past 24 hour(s))  CBC     Status: Abnormal   Collection Time: 06/20/16  3:27 AM  Result Value Ref Range   WBC 11.2 (H) 4.0 - 10.5 K/uL   RBC 1.96 (L) 3.87 - 5.11 MIL/uL   Hemoglobin 6.0 (LL) 12.0 - 15.0 g/dL   HCT 17.2 (L) 36.0 - 46.0 %   MCV 87.8 78.0 - 100.0 fL   MCH 30.6 26.0 - 34.0 pg   MCHC 34.9 30.0 - 36.0 g/dL   RDW 14.3 11.5 - 15.5 %   Platelets 139 (L) 150 - 400 K/uL  Basic metabolic panel     Status: Abnormal   Collection Time: 06/20/16  3:27 AM  Result Value Ref Range   Sodium 135 135 - 145 mmol/L   Potassium 4.0 3.5 - 5.1 mmol/L   Chloride 109 101 - 111 mmol/L   CO2 17 (L) 22 - 32 mmol/L   Glucose, Bld 110 (H) 65 - 99 mg/dL   BUN 21 (H) 6 - 20 mg/dL   Creatinine, Ser 0.81 0.44 - 1.00 mg/dL   Calcium 7.8 (L) 8.9 - 10.3 mg/dL   GFR calc non Af Amer >60 >60 mL/min   GFR calc Af Amer >60 >60 mL/min   Anion gap 9 5 - 15  Prepare RBC     Status: None   Collection Time: 06/20/16  6:17 AM  Result Value Ref Range   Order Confirmation ORDER PROCESSED BY BLOOD BANK     Studies/Results: Nm Gi Blood Loss  Result Date: 06/19/2016 CLINICAL DATA:  GI bleed. EXAM: NUCLEAR MEDICINE GASTROINTESTINAL BLEEDING SCAN TECHNIQUE: Sequential abdominal images were obtained following intravenous administration of Tc-69m labeled red blood cells. RADIOPHARMACEUTICALS:  25.7 mCi  Tc-34m in-vitro labeled red cells. COMPARISON:  None. FINDINGS: The study is positive for a GI bleed beginning in the left lower abdomen/ upper pelvis. The patient was very agitated with significant motion precluding further imaging. However, the bleed appears to originate in the descending colon. IMPRESSION: GI bleed beginning in the left lower abdomen/ upper pelvis, thought to originate in the descending colon. These results will be called to the ordering clinician or representative by the Radiologist Assistant, and communication documented in the PACS or zVision Dashboard. Electronically Signed   By: Dorise Bullion III M.D   On: 06/19/2016 13:00   Ir Angiogram Visceral Selective  Result Date: 06/19/2016 INDICATION: 82 year old female with new painless lower gastrointestinal hemorrhage, positive nuclear medicine bleeding scan, and hemodynamic instability. She has been referred for evaluation of angiogram and possible therapy. EXAM: SELECTIVE VISCERAL ARTERIOGRAPHY; IR ULTRASOUND GUIDANCE VASC ACCESS RIGHT; IR EMBO ART VEN HEMORR LYMPH EXTRAV INC GUIDE ROADMAPPING; ARTERIOGRAPHY; ADDITIONAL ARTERIOGRAPHY MEDICATIONS: None ANESTHESIA/SEDATION: Moderate (conscious) sedation was employed during this procedure. A total of Versed 1.5 mg and Fentanyl 62.5 mcg was administered intravenously. Moderate Sedation Time: 90 minutes. The patient's level of consciousness and  vital signs were monitored continuously by radiology nursing throughout the procedure under my direct supervision. CONTRAST:  270 cc FLUOROSCOPY TIME:  Fluoroscopy Time: 27 minutes 36 seconds (74.9 mGy). COMPLICATIONS: None immediate. PROCEDURE: Informed consent was obtained from the patient following explanation of the procedure, risks, benefits and alternatives. The patient understands, agrees and consents for the procedure. All questions were addressed. A time out was performed prior to the initiation of the procedure. Maximal barrier sterile  technique utilized including caps, mask, sterile gowns, sterile gloves, large sterile drape, hand hygiene, and Betadine prep. Ultrasound survey of the right inguinal region was performed with images stored and sent to PACs. A micropuncture needle was used access the right common femoral artery under ultrasound. With excellent arterial blood flow returned, and an .018 micro wire was passed through the needle, observed enter the abdominal aorta under fluoroscopy. The needle was removed, and a micropuncture sheath was placed over the wire. The inner dilator and wire were removed, and an 035 Bentson wire was advanced under fluoroscopy into the abdominal aorta. The sheath was removed and a standard 5 [place] vascular sheath was placed. The dilator was removed and the sheath was flushed. Standard C2 Cobra catheter was advanced over the wire into the abdomen. Cobra catheter was used to select the superior mesenteric artery. Mesenteric artery angiogram was performed. Cobra catheter was then exchanged for an Omni Flush catheter. Lower aortic angiogram and pelvic angiogram performed. Variation of catheters used to select inferior mesenteric artery. CT cobra catheter, Chung 2.5 catheter, and Roche inferior mesenteric catheter were used. The rim catheter was successful in selecting the origin of the inferior mesenteric artery. IMA angiogram performed. Micro catheter system was then advanced through the rim catheter. 105 cm micro catheter and a combination of a 14 Fathom and a synchro soft were used for selective/sub selective angiogram of branches of the superior rectal artery. Once the catheter tip was confirmed to be within abnormal fourth order vessel of the superior rectal branches, coil embolization was performed with 2 mm x 2 cm and a 2 mm x 4 cm coil. Final angiogram performed. Angiogram of the right common femoral artery was performed. Exoseal was deployed for hemostasis. FINDINGS: Superior mesenteric artery angiogram  demonstrates patent vasculature with no tumor blush, extravasation of contrast, or contrast stasis. Contribution through the middle colic artery to the gastroepiploic artery. Lower aortic angiogram demonstrates mild atherosclerotic changes with no aneurysm or dissection. Moderate atherosclerotic changes of the right common iliac artery with mild atherosclerotic changes of the left common iliac artery. No asymmetric flow. Bilateral L5 lumbar arteries are patent. Bilateral hypogastric arteries patent. No significant atherosclerotic changes of the proximal femoral vasculature. IMA angiogram demonstrates venous shunting/contrast staining associated with fourth order branches of the superior rectal artery. Widely patent left colic artery with patent marginal artery of [place]. Status post coil embolization of abnormal fourth order branch with no ongoing contrast staining or venous shunting. IMPRESSION: Status post mesenteric artery angiogram and coil embolization of abnormal fourth order branch of superior rectal artery for treatment of acute lower GI hemorrhage. Deployment of Exoseal for right common femoral artery hemostasis. Signed, Dulcy Fanny. Earleen Newport, DO [facility] Radiology Electronically Signed   By: Corrie Mckusick D.O.   On: 06/19/2016 18:19   Ir Angiogram Visceral Selective  Result Date: 06/19/2016 INDICATION: 82 year old female with new painless lower gastrointestinal hemorrhage, positive nuclear medicine bleeding scan, and hemodynamic instability. She has been referred for evaluation of angiogram and possible  therapy. EXAM: SELECTIVE VISCERAL ARTERIOGRAPHY; IR ULTRASOUND GUIDANCE VASC ACCESS RIGHT; IR EMBO ART VEN HEMORR LYMPH EXTRAV INC GUIDE ROADMAPPING; ARTERIOGRAPHY; ADDITIONAL ARTERIOGRAPHY MEDICATIONS: None ANESTHESIA/SEDATION: Moderate (conscious) sedation was employed during this procedure. A total of Versed 1.5 mg and Fentanyl 62.5 mcg was  administered intravenously. Moderate Sedation Time: 90 minutes. The patient's level of consciousness and vital signs were monitored continuously by radiology nursing throughout the procedure under my direct supervision. CONTRAST:  270 cc FLUOROSCOPY TIME:  Fluoroscopy Time: 27 minutes 36 seconds (74.9 mGy). COMPLICATIONS: None immediate. PROCEDURE: Informed consent was obtained from the patient following explanation of the procedure, risks, benefits and alternatives. The patient understands, agrees and consents for the procedure. All questions were addressed. A time out was performed prior to the initiation of the procedure. Maximal barrier sterile technique utilized including caps, mask, sterile gowns, sterile gloves, large sterile drape, hand hygiene, and Betadine prep. Ultrasound survey of the right inguinal region was performed with images stored and sent to PACs. A micropuncture needle was used access the right common femoral artery under ultrasound. With excellent arterial blood flow returned, and an .018 micro wire was passed through the needle, observed enter the abdominal aorta under fluoroscopy. The needle was removed, and a micropuncture sheath was placed over the wire. The inner dilator and wire were removed, and an 035 Bentson wire was advanced under fluoroscopy into the abdominal aorta. The sheath was removed and a standard 5 [place] vascular sheath was placed. The dilator was removed and the sheath was flushed. Standard C2 Cobra catheter was advanced over the wire into the abdomen. Cobra catheter was used to select the superior mesenteric artery. Mesenteric artery angiogram was performed. Cobra catheter was then exchanged for an Omni Flush catheter. Lower aortic angiogram and pelvic angiogram performed. Variation of catheters used to select inferior mesenteric artery. CT cobra catheter, Chung 2.5 catheter, and Roche inferior mesenteric catheter were used. The rim catheter was successful in selecting  the origin of the inferior mesenteric artery. IMA angiogram performed. Micro catheter system was then advanced through the rim catheter. 105 cm micro catheter and a combination of a 14 Fathom and a synchro soft were used for selective/sub selective angiogram of branches of the superior rectal artery. Once the catheter tip was confirmed to be within abnormal fourth order vessel of the superior rectal branches, coil embolization was performed with 2 mm x 2 cm and a 2 mm x 4 cm coil. Final angiogram performed. Angiogram of the right common femoral artery was performed. Exoseal was deployed for hemostasis. FINDINGS: Superior mesenteric artery angiogram demonstrates patent vasculature with no tumor blush, extravasation of contrast, or contrast stasis. Contribution through the middle colic artery to the gastroepiploic artery. Lower aortic angiogram demonstrates mild atherosclerotic changes with no aneurysm or dissection. Moderate atherosclerotic changes of the right common iliac artery with mild atherosclerotic changes of the left common iliac artery. No asymmetric flow. Bilateral L5 lumbar arteries are patent. Bilateral hypogastric arteries patent. No significant atherosclerotic changes of the proximal femoral vasculature. IMA angiogram demonstrates venous shunting/contrast staining associated with fourth order branches of the superior rectal artery. Widely patent left colic artery with patent marginal artery of [place]. Status post coil embolization of abnormal fourth order branch with no ongoing contrast staining or venous shunting. IMPRESSION: Status post mesenteric artery angiogram and coil embolization of abnormal fourth order branch of superior rectal artery for treatment of acute lower GI hemorrhage. Deployment of Exoseal for right common femoral artery hemostasis. Signed, Dulcy Fanny. Earleen Newport,  DO [facility] Radiology Electronically Signed   By: Corrie Mckusick D.O.    On: 06/19/2016 18:19   Ir Angiogram Selective Each Additional Vessel  Result Date: 06/19/2016 INDICATION: 82 year old female with new painless lower gastrointestinal hemorrhage, positive nuclear medicine bleeding scan, and hemodynamic instability. She has been referred for evaluation of angiogram and possible therapy. EXAM: SELECTIVE VISCERAL ARTERIOGRAPHY; IR ULTRASOUND GUIDANCE VASC ACCESS RIGHT; IR EMBO ART VEN HEMORR LYMPH EXTRAV INC GUIDE ROADMAPPING; ARTERIOGRAPHY; ADDITIONAL ARTERIOGRAPHY MEDICATIONS: None ANESTHESIA/SEDATION: Moderate (conscious) sedation was employed during this procedure. A total of Versed 1.5 mg and Fentanyl 62.5 mcg was administered intravenously. Moderate Sedation Time: 90 minutes. The patient's level of consciousness and vital signs were monitored continuously by radiology nursing throughout the procedure under my direct supervision. CONTRAST:  270 cc FLUOROSCOPY TIME:  Fluoroscopy Time: 27 minutes 36 seconds (74.9 mGy). COMPLICATIONS: None immediate. PROCEDURE: Informed consent was obtained from the patient following explanation of the procedure, risks, benefits and alternatives. The patient understands, agrees and consents for the procedure. All questions were addressed. A time out was performed prior to the initiation of the procedure. Maximal barrier sterile technique utilized including caps, mask, sterile gowns, sterile gloves, large sterile drape, hand hygiene, and Betadine prep. Ultrasound survey of the right inguinal region was performed with images stored and sent to PACs. A micropuncture needle was used access the right common femoral artery under ultrasound. With excellent arterial blood flow returned, and an .018 micro wire was passed through the needle, observed enter the abdominal aorta under fluoroscopy. The needle was removed, and a micropuncture sheath was placed over the wire. The inner dilator and wire were removed, and an 035 Bentson wire was advanced under  fluoroscopy into the abdominal aorta. The sheath was removed and a standard 5 [place] vascular sheath was placed. The dilator was removed and the sheath was flushed. Standard C2 Cobra catheter was advanced over the wire into the abdomen. Cobra catheter was used to select the superior mesenteric artery. Mesenteric artery angiogram was performed. Cobra catheter was then exchanged for an Omni Flush catheter. Lower aortic angiogram and pelvic angiogram performed. Variation of catheters used to select inferior mesenteric artery. CT cobra catheter, Chung 2.5 catheter, and Roche inferior mesenteric catheter were used. The rim catheter was successful in selecting the origin of the inferior mesenteric artery. IMA angiogram performed. Micro catheter system was then advanced through the rim catheter. 105 cm micro catheter and a combination of a 14 Fathom and a synchro soft were used for selective/sub selective angiogram of branches of the superior rectal artery. Once the catheter tip was confirmed to be within abnormal fourth order vessel of the superior rectal branches, coil embolization was performed with 2 mm x 2 cm and a 2 mm x 4 cm coil. Final angiogram performed. Angiogram of the right common femoral artery was performed. Exoseal was deployed for hemostasis. FINDINGS: Superior mesenteric artery angiogram demonstrates patent vasculature with no tumor blush, extravasation of contrast, or contrast stasis. Contribution through the middle colic artery to the gastroepiploic artery. Lower aortic angiogram demonstrates mild atherosclerotic changes with no aneurysm or dissection. Moderate atherosclerotic changes of the right common iliac artery with mild atherosclerotic changes of the left common iliac artery. No asymmetric flow. Bilateral L5 lumbar arteries are patent. Bilateral hypogastric arteries patent. No significant atherosclerotic changes of the proximal femoral vasculature. IMA angiogram demonstrates venous  shunting/contrast staining associated with fourth order branches of the superior rectal artery. Widely patent left colic  artery with patent marginal artery of [place]. Status post coil embolization of abnormal fourth order branch with no ongoing contrast staining or venous shunting. IMPRESSION: Status post mesenteric artery angiogram and coil embolization of abnormal fourth order branch of superior rectal artery for treatment of acute lower GI hemorrhage. Deployment of Exoseal for right common femoral artery hemostasis. Signed, Dulcy Fanny. Earleen Newport, DO [facility] Radiology Electronically Signed   By: Corrie Mckusick D.O.   On: 06/19/2016 18:19   Ir Angiogram Selective Each Additional Vessel  Result Date: 06/19/2016 INDICATION: 82 year old female with new painless lower gastrointestinal hemorrhage, positive nuclear medicine bleeding scan, and hemodynamic instability. She has been referred for evaluation of angiogram and possible therapy. EXAM: SELECTIVE VISCERAL ARTERIOGRAPHY; IR ULTRASOUND GUIDANCE VASC ACCESS RIGHT; IR EMBO ART VEN HEMORR LYMPH EXTRAV INC GUIDE ROADMAPPING; ARTERIOGRAPHY; ADDITIONAL ARTERIOGRAPHY MEDICATIONS: None ANESTHESIA/SEDATION: Moderate (conscious) sedation was employed during this procedure. A total of Versed 1.5 mg and Fentanyl 62.5 mcg was administered intravenously. Moderate Sedation Time: 90 minutes. The patient's level of consciousness and vital signs were monitored continuously by radiology nursing throughout the procedure under my direct supervision. CONTRAST:  270 cc FLUOROSCOPY TIME:  Fluoroscopy Time: 27 minutes 36 seconds (74.9 mGy). COMPLICATIONS: None immediate. PROCEDURE: Informed consent was obtained from the patient following explanation of the procedure, risks, benefits and alternatives. The patient understands, agrees and consents for the procedure. All questions were addressed. A time out was performed prior to the  initiation of the procedure. Maximal barrier sterile technique utilized including caps, mask, sterile gowns, sterile gloves, large sterile drape, hand hygiene, and Betadine prep. Ultrasound survey of the right inguinal region was performed with images stored and sent to PACs. A micropuncture needle was used access the right common femoral artery under ultrasound. With excellent arterial blood flow returned, and an .018 micro wire was passed through the needle, observed enter the abdominal aorta under fluoroscopy. The needle was removed, and a micropuncture sheath was placed over the wire. The inner dilator and wire were removed, and an 035 Bentson wire was advanced under fluoroscopy into the abdominal aorta. The sheath was removed and a standard 5 [place] vascular sheath was placed. The dilator was removed and the sheath was flushed. Standard C2 Cobra catheter was advanced over the wire into the abdomen. Cobra catheter was used to select the superior mesenteric artery. Mesenteric artery angiogram was performed. Cobra catheter was then exchanged for an Omni Flush catheter. Lower aortic angiogram and pelvic angiogram performed. Variation of catheters used to select inferior mesenteric artery. CT cobra catheter, Chung 2.5 catheter, and Roche inferior mesenteric catheter were used. The rim catheter was successful in selecting the origin of the inferior mesenteric artery. IMA angiogram performed. Micro catheter system was then advanced through the rim catheter. 105 cm micro catheter and a combination of a 14 Fathom and a synchro soft were used for selective/sub selective angiogram of branches of the superior rectal artery. Once the catheter tip was confirmed to be within abnormal fourth order vessel of the superior rectal branches, coil embolization was performed with 2 mm x 2 cm and a 2 mm x 4 cm coil. Final angiogram performed. Angiogram of the right common femoral artery was performed. Exoseal was deployed for  hemostasis. FINDINGS: Superior mesenteric artery angiogram demonstrates patent vasculature with no tumor blush, extravasation of contrast, or contrast stasis. Contribution through the middle colic artery to the gastroepiploic artery. Lower aortic angiogram demonstrates mild atherosclerotic changes with no aneurysm or dissection.  Moderate atherosclerotic changes of the right common iliac artery with mild atherosclerotic changes of the left common iliac artery. No asymmetric flow. Bilateral L5 lumbar arteries are patent. Bilateral hypogastric arteries patent. No significant atherosclerotic changes of the proximal femoral vasculature. IMA angiogram demonstrates venous shunting/contrast staining associated with fourth order branches of the superior rectal artery. Widely patent left colic artery with patent marginal artery of [place]. Status post coil embolization of abnormal fourth order branch with no ongoing contrast staining or venous shunting. IMPRESSION: Status post mesenteric artery angiogram and coil embolization of abnormal fourth order branch of superior rectal artery for treatment of acute lower GI hemorrhage. Deployment of Exoseal for right common femoral artery hemostasis. Signed, Dulcy Fanny. Earleen Newport, DO [facility] Radiology Electronically Signed   By: Corrie Mckusick D.O.   On: 06/19/2016 18:19   Ir Angiogram Follow Up Study  Result Date: 06/19/2016 INDICATION: 82 year old female with new painless lower gastrointestinal hemorrhage, positive nuclear medicine bleeding scan, and hemodynamic instability. She has been referred for evaluation of angiogram and possible therapy. EXAM: SELECTIVE VISCERAL ARTERIOGRAPHY; IR ULTRASOUND GUIDANCE VASC ACCESS RIGHT; IR EMBO ART VEN HEMORR LYMPH EXTRAV INC GUIDE ROADMAPPING; ARTERIOGRAPHY; ADDITIONAL ARTERIOGRAPHY MEDICATIONS: None ANESTHESIA/SEDATION: Moderate (conscious) sedation was employed during this procedure. A  total of Versed 1.5 mg and Fentanyl 62.5 mcg was administered intravenously. Moderate Sedation Time: 90 minutes. The patient's level of consciousness and vital signs were monitored continuously by radiology nursing throughout the procedure under my direct supervision. CONTRAST:  270 cc FLUOROSCOPY TIME:  Fluoroscopy Time: 27 minutes 36 seconds (74.9 mGy). COMPLICATIONS: None immediate. PROCEDURE: Informed consent was obtained from the patient following explanation of the procedure, risks, benefits and alternatives. The patient understands, agrees and consents for the procedure. All questions were addressed. A time out was performed prior to the initiation of the procedure. Maximal barrier sterile technique utilized including caps, mask, sterile gowns, sterile gloves, large sterile drape, hand hygiene, and Betadine prep. Ultrasound survey of the right inguinal region was performed with images stored and sent to PACs. A micropuncture needle was used access the right common femoral artery under ultrasound. With excellent arterial blood flow returned, and an .018 micro wire was passed through the needle, observed enter the abdominal aorta under fluoroscopy. The needle was removed, and a micropuncture sheath was placed over the wire. The inner dilator and wire were removed, and an 035 Bentson wire was advanced under fluoroscopy into the abdominal aorta. The sheath was removed and a standard 5 [place] vascular sheath was placed. The dilator was removed and the sheath was flushed. Standard C2 Cobra catheter was advanced over the wire into the abdomen. Cobra catheter was used to select the superior mesenteric artery. Mesenteric artery angiogram was performed. Cobra catheter was then exchanged for an Omni Flush catheter. Lower aortic angiogram and pelvic angiogram performed. Variation of catheters used to select inferior mesenteric artery. CT cobra catheter, Chung 2.5 catheter, and Roche inferior mesenteric catheter were  used. The rim catheter was successful in selecting the origin of the inferior mesenteric artery. IMA angiogram performed. Micro catheter system was then advanced through the rim catheter. 105 cm micro catheter and a combination of a 14 Fathom and a synchro soft were used for selective/sub selective angiogram of branches of the superior rectal artery. Once the catheter tip was confirmed to be within abnormal fourth order vessel of the superior rectal branches, coil embolization was performed with 2 mm x 2 cm and a 2 mm x  4 cm coil. Final angiogram performed. Angiogram of the right common femoral artery was performed. Exoseal was deployed for hemostasis. FINDINGS: Superior mesenteric artery angiogram demonstrates patent vasculature with no tumor blush, extravasation of contrast, or contrast stasis. Contribution through the middle colic artery to the gastroepiploic artery. Lower aortic angiogram demonstrates mild atherosclerotic changes with no aneurysm or dissection. Moderate atherosclerotic changes of the right common iliac artery with mild atherosclerotic changes of the left common iliac artery. No asymmetric flow. Bilateral L5 lumbar arteries are patent. Bilateral hypogastric arteries patent. No significant atherosclerotic changes of the proximal femoral vasculature. IMA angiogram demonstrates venous shunting/contrast staining associated with fourth order branches of the superior rectal artery. Widely patent left colic artery with patent marginal artery of [place]. Status post coil embolization of abnormal fourth order branch with no ongoing contrast staining or venous shunting. IMPRESSION: Status post mesenteric artery angiogram and coil embolization of abnormal fourth order branch of superior rectal artery for treatment of acute lower GI hemorrhage. Deployment of Exoseal for right common femoral artery hemostasis. Signed, Dulcy Fanny. Earleen Newport, DO [facility]  Electronically Signed   By: Corrie Mckusick D.O.   On: 06/19/2016 18:19   Ir US Guide Vasc Access Right  Result Date: 06/19/2016 INDICATION: 82 year old female with new painless lower gastrointestinal hemorrhage, positive nuclear medicine bleeding scan, and hemodynamic instability. She has been referred for evaluation of angiogram and possible therapy. EXAM: SELECTIVE VISCERAL ARTERIOGRAPHY; IR ULTRASOUND GUIDANCE VASC ACCESS RIGHT; IR EMBO ART VEN HEMORR LYMPH EXTRAV INC GUIDE ROADMAPPING; ARTERIOGRAPHY; ADDITIONAL ARTERIOGRAPHY MEDICATIONS: None ANESTHESIA/SEDATION: Moderate (conscious) sedation was employed during this procedure. A total of Versed 1.5 mg and Fentanyl 62.5 mcg was administered intravenously. Moderate Sedation Time: 90 minutes. The patient's level of consciousness and vital signs were monitored continuously by radiology nursing throughout the procedure under my direct supervision. CONTRAST:  270 cc FLUOROSCOPY TIME:  Fluoroscopy Time: 27 minutes 36 seconds (74.9 mGy). COMPLICATIONS: None immediate. PROCEDURE: Informed consent was obtained from the patient following explanation of the procedure, risks, benefits and alternatives. The patient understands, agrees and consents for the procedure. All questions were addressed. A time out was performed prior to the initiation of the procedure. Maximal barrier sterile technique utilized including caps, mask, sterile gowns, sterile gloves, large sterile drape, hand hygiene, and Betadine prep. Ultrasound survey of the right inguinal region was performed with images stored and sent to PACs. A micropuncture needle was used access the right common femoral artery under ultrasound. With excellent arterial blood flow returned, and an .018 micro wire was passed through the needle, observed enter the abdominal aorta under fluoroscopy. The needle was removed, and a micropuncture sheath was placed over the wire. The inner dilator and wire were removed, and an 035  Bentson wire was advanced under fluoroscopy into the abdominal aorta. The sheath was removed and a standard 5 [place] vascular sheath was placed. The dilator was removed and the sheath was flushed. Standard C2 Cobra catheter was advanced over the wire into the abdomen. Cobra catheter was used to select the superior mesenteric artery. Mesenteric artery angiogram was performed. Cobra catheter was then exchanged for an Omni Flush catheter. Lower aortic angiogram and pelvic angiogram performed. Variation of catheters used to select inferior mesenteric artery. CT cobra catheter, Chung 2.5 catheter, and Roche inferior mesenteric catheter were used. The rim catheter was successful in selecting the origin of the inferior mesenteric artery. IMA angiogram performed. Micro catheter system was then advanced through the rim catheter.  105 cm micro catheter and a combination of a 14 Fathom and a synchro soft were used for selective/sub selective angiogram of branches of the superior rectal artery. Once the catheter tip was confirmed to be within abnormal fourth order vessel of the superior rectal branches, coil embolization was performed with 2 mm x 2 cm and a 2 mm x 4 cm coil. Final angiogram performed. Angiogram of the right common femoral artery was performed. Exoseal was deployed for hemostasis. FINDINGS: Superior mesenteric artery angiogram demonstrates patent vasculature with no tumor blush, extravasation of contrast, or contrast stasis. Contribution through the middle colic artery to the gastroepiploic artery. Lower aortic angiogram demonstrates mild atherosclerotic changes with no aneurysm or dissection. Moderate atherosclerotic changes of the right common iliac artery with mild atherosclerotic changes of the left common iliac artery. No asymmetric flow. Bilateral L5 lumbar arteries are patent. Bilateral hypogastric arteries patent. No significant atherosclerotic changes of the proximal femoral vasculature. IMA angiogram  demonstrates venous shunting/contrast staining associated with fourth order branches of the superior rectal artery. Widely patent left colic artery with patent marginal artery of [place]. Status post coil embolization of abnormal fourth order branch with no ongoing contrast staining or venous shunting. IMPRESSION: Status post mesenteric artery angiogram and coil embolization of abnormal fourth order branch of superior rectal artery for treatment of acute lower GI hemorrhage. Deployment of Exoseal for right common femoral artery hemostasis. Signed, Dulcy Fanny. Earleen Newport, DO [facility] Electronically Signed   By: Corrie Mckusick D.O.   On: 06/19/2016 18:19   Ir Embo Art  [place] Guide Roadmapping  Result Date: 06/19/2016 INDICATION: 82 year old female with new painless lower gastrointestinal hemorrhage, positive nuclear medicine bleeding scan, and hemodynamic instability. She has been referred for evaluation of angiogram and possible therapy. EXAM: SELECTIVE VISCERAL ARTERIOGRAPHY; IR ULTRASOUND GUIDANCE VASC ACCESS RIGHT; IR EMBO ART VEN HEMORR LYMPH EXTRAV INC GUIDE ROADMAPPING; ARTERIOGRAPHY; ADDITIONAL ARTERIOGRAPHY MEDICATIONS: None ANESTHESIA/SEDATION: Moderate (conscious) sedation was employed during this procedure. A total of Versed 1.5 mg and Fentanyl 62.5 mcg was administered intravenously. Moderate Sedation Time: 90 minutes. The patient's level of consciousness and vital signs were monitored continuously by radiology nursing throughout the procedure under my direct supervision. CONTRAST:  270 cc FLUOROSCOPY TIME:  Fluoroscopy Time: 27 minutes 36 seconds (74.9 mGy). COMPLICATIONS: None immediate. PROCEDURE: Informed consent was obtained from the patient following explanation of the procedure, risks, benefits and alternatives. The patient understands, agrees and consents for the procedure. All questions were addressed. A time out  was performed prior to the initiation of the procedure. Maximal barrier sterile technique utilized including caps, mask, sterile gowns, sterile gloves, large sterile drape, hand hygiene, and Betadine prep. Ultrasound survey of the right inguinal region was performed with images stored and sent to PACs. A micropuncture needle was used access the right common femoral artery under ultrasound. With excellent arterial blood flow returned, and an .018 micro wire was passed through the needle, observed enter the abdominal aorta under fluoroscopy. The needle was removed, and a micropuncture sheath was placed over the wire. The inner dilator and wire were removed, and an 035 Bentson wire was advanced under fluoroscopy into the abdominal aorta. The sheath was removed and a standard 5 [place] vascular sheath was placed. The dilator was removed and the sheath was flushed. Standard C2 Cobra catheter was advanced over the wire into the abdomen. Cobra catheter was used to select the superior mesenteric artery. Mesenteric artery angiogram was  performed. Cobra catheter was then exchanged for an Omni Flush catheter. Lower aortic angiogram and pelvic angiogram performed. Variation of catheters used to select inferior mesenteric artery. CT cobra catheter, Chung 2.5 catheter, and Roche inferior mesenteric catheter were used. The rim catheter was successful in selecting the origin of the inferior mesenteric artery. IMA angiogram performed. Micro catheter system was then advanced through the rim catheter. 105 cm micro catheter and a combination of a 14 Fathom and a synchro soft were used for selective/sub selective angiogram of branches of the superior rectal artery. Once the catheter tip was confirmed to be within abnormal fourth order vessel of the superior rectal branches, coil embolization was performed with 2 mm x 2 cm and a 2 mm x 4 cm coil. Final angiogram performed. Angiogram of the right common femoral artery was performed.  Exoseal was deployed for hemostasis. FINDINGS: Superior mesenteric artery angiogram demonstrates patent vasculature with no tumor blush, extravasation of contrast, or contrast stasis. Contribution through the middle colic artery to the gastroepiploic artery. Lower aortic angiogram demonstrates mild atherosclerotic changes with no aneurysm or dissection. Moderate atherosclerotic changes of the right common iliac artery with mild atherosclerotic changes of the left common iliac artery. No asymmetric flow. Bilateral L5 lumbar arteries are patent. Bilateral hypogastric arteries patent. No significant atherosclerotic changes of the proximal femoral vasculature. IMA angiogram demonstrates venous shunting/contrast staining associated with fourth order branches of the superior rectal artery. Widely patent left colic artery with patent marginal artery of [place]. Status post coil embolization of abnormal fourth order branch with no ongoing contrast staining or venous shunting. IMPRESSION: Status post mesenteric artery angiogram and coil embolization of abnormal fourth order branch of superior rectal artery for treatment of acute lower GI hemorrhage. Deployment of Exoseal for right common femoral artery hemostasis. Signed, Dulcy Fanny. Earleen Newport, DO [facility] Electronically Signed   By: Corrie Mckusick D.O.   On: 06/19/2016 18:19      Assessment: Lower GI bleed, S/P embolization.  Plan:   Transfuse blood as needed. Observe for further bleeding. We will sign off. Call if needed.    SAM F Sally-Anne Wamble 06/20/2016, 9:01 AM  Pager: [phone] If no answer or after 5 PM call [phone]

## 2016-06-20 NOTE — Progress Notes (Addendum)
PROGRESS NOTE  Alicia Gomez [id number] DOB: September 01, 1925 DOA: 06/18/2016 PCP: Jani Gravel, MD  Brief History:  82 year old female with a history of systolic CHF, hypertension, hyperlipidemia, remote atrial fibrillation presented with painless hematochezia that began on 06/17/2016. On the morning of 06/18/2016, the patient woke up with blood on her clothing and bedding. As result, EMS was activated, the patient was brought to the hospital for further evaluation. The patient did not have any fevers, chills, abdominal pain, vomiting, dysuria, hematuria. Apparently, the patient had a colonoscopy over 10 years ago which showed diverticulosis. Upon presentation, the patient was noted have hemoglobin of 8.7.  [facility] was consulted to assist with management. The patient denies any long-term use of NSAIDs.  Assessment/Plan: Lower GI bleed -Likely secondary to diverticular bleed -Patient had 3 more large episodes of hematochezia AM 06/19/2016 -Appreciate GI follow-up -06/19/16--NM RBC scan--GI bleed beginning in the left lower abdomen/ upper pelvis, thought to originate in the descending colon -06/19/16--IR coil embolization superior rectal branches -06/20/16--transfuse 2 units PRBC, scant hematochezia -discussed with GI--advance diet, monitor Hgb, home 11/29 if stable  Acute blood loss anemia -Baseline hemoglobin approximately 11-12 -Since the initial drop of hemoglobin, crit hemoglobin has remained largely stable -Patient remains hemodynamically stable  Chronic systolic CHF -Clinically euvolemic  HTN -stable -holding carvedilol  Hyponatremia -due to volume depletion -improved after volume resuscitation  PAF -"resolved years ago" per pt. -presently in sinus -holding ASA for now -11/28 Echo--EF 55-60%, grade 2 DD, mod TR, PASP 48  Peripheral neuropathy: - continue neurontin  Hyperglycemia -check A1C--pending     Disposition Plan:   Home 11/29 if  stable Family Communication:   Family updated at bedside 11/28   Consultants:  [facility]  Code Status:  FULL   DVT Prophylaxis:  SCDs   Procedures: As Listed in Progress Note Above  Antibiotics: None   Subjective:   Objective: Vitals:   06/20/16 1013 06/20/16 1036 06/20/16 1051 06/20/16 1330  BP: (!) 113/40 119/66 (!) 111/52 138/62  Pulse: 80 82    Resp: 18 18 18 18   Temp: 98.6 F (37 C) 98.6 F (37 C) 98 F (36.7 C) 98.5 F (36.9 C)  TempSrc: Oral Oral Oral Oral  SpO2: 100% 100%  96%  Weight:      Height:        Intake/Output Summary (Last 24 hours) at 06/20/16 1912 Last data filed at 06/20/16 1856  Gross per 24 hour  Intake             1153 ml  Output                0 ml  Net             1153 ml   Weight change:  Exam:   General:  Pt is alert, follows commands appropriately, not in acute distress  HEENT: No icterus, No thrush, No neck mass, [place]/AT  Cardiovascular: RRR, S1/S2, no rubs, no gallops  Respiratory: diminished BS, no wheeze  Abdomen: Soft/+BS, non tender, non distended, no guarding  Extremities: trace LE edema, No lymphangitis, No petechiae, No rashes, no synovitis   Data Reviewed: I have personally reviewed following labs and imaging studies Basic Metabolic Panel:  Recent Labs Lab 06/18/16 0558 06/18/16 0943 06/19/16 0339 06/20/16 0327  NA 132* 131* 131* 135  K 4.6 4.1 3.9 4.0  CL 98* 99* 104 109  CO2 25  --  22 17*  GLUCOSE 135* 111* 106* 110*  BUN 23* 21* 18 21*  CREATININE 0.93 0.90 0.82 0.81  CALCIUM 9.6  --  8.2* 7.8*   Liver Function Tests:  Recent Labs Lab 06/18/16 0558  AST 29  ALT 16  ALKPHOS 32*  BILITOT 0.3  PROT 6.6  ALBUMIN 4.0   No results for input(s): LIPASE, AMYLASE in the last 168 hours. No results for input(s): AMMONIA in the last 168 hours. Coagulation Profile:  Recent Labs Lab 06/18/16 0627  INR 0.99   CBC:  Recent Labs Lab 06/18/16 0558 06/18/16 0934 06/18/16 0943  06/18/16 1818 06/19/16 0339 06/20/16 0327  WBC 6.8 5.7  --  5.4 5.6 11.2*  HGB 11.2* 8.7* 8.2* 9.3* 10.4* 6.0*  HCT 33.4* 25.3* 24.0* 27.1* 29.5* 17.2*  MCV 91.5 90.7  --  89.4 86.8 87.8  PLT 284 225  --  201 162 139*   Cardiac Enzymes: No results for input(s): CKTOTAL, CKMB, CKMBINDEX, TROPONINI in the last 168 hours. BNP: Invalid input(s): POCBNP CBG: No results for input(s): GLUCAP in the last 168 hours. HbA1C: No results for input(s): HGBA1C in the last 72 hours. Urine analysis:    Component Value Date/Time   COLORURINE YELLOW 12/08/2010 1200   APPEARANCEUR CLEAR 12/08/2010 1200   LABSPEC 1.017 12/08/2010 1200   PHURINE 6.0 12/08/2010 1200   GLUCOSEU NEGATIVE 12/08/2010 1200   HGBUR NEGATIVE 12/08/2010 1200   BILIRUBINUR NEGATIVE 12/08/2010 1200   KETONESUR 15 (A) 12/08/2010 1200   PROTEINUR NEGATIVE 12/08/2010 1200   UROBILINOGEN 0.2 12/08/2010 1200   NITRITE NEGATIVE 12/08/2010 1200   LEUKOCYTESUR  12/08/2010 1200    NEGATIVE MICROSCOPIC NOT DONE ON URINES WITH NEGATIVE PROTEIN, BLOOD, LEUKOCYTES, NITRITE, OR GLUCOSE <1000 mg/dL.   Sepsis Labs: @LABRCNTIP (procalcitonin:4,lacticidven:4) )No results found for this or any previous visit (from the past 240 hour(s)).   Scheduled Meds: . sodium chloride   Intravenous Once  . gabapentin  100 mg Oral BID  . latanoprost  1 drop Both Eyes QHS  . polyethylene glycol  17 g Oral BID  . sodium chloride flush  3 mL Intravenous Q12H  . timolol  1 drop Both Eyes BID   Continuous Infusions:  Procedures/Studies: Nm Gi Blood Loss  Result Date: 06/19/2016 CLINICAL DATA:  GI bleed. EXAM: NUCLEAR MEDICINE GASTROINTESTINAL BLEEDING SCAN TECHNIQUE: Sequential abdominal images were obtained following intravenous administration of Tc-84m labeled red blood cells. RADIOPHARMACEUTICALS:  25.7 mCi Tc-67m in-vitro labeled red cells. COMPARISON:  None. FINDINGS: The study is positive for a GI bleed beginning in the left lower abdomen/ upper  pelvis. The patient was very agitated with significant motion precluding further imaging. However, the bleed appears to originate in the descending colon. IMPRESSION: GI bleed beginning in the left lower abdomen/ upper pelvis, thought to originate in the descending colon. These results will be called to the ordering clinician or representative by the Radiologist Assistant, and communication documented in the PACS or zVision Dashboard. Electronically Signed   By: Dorise Bullion III M.D   On: 06/19/2016 13:00   Ir Angiogram Visceral Selective  Result Date: 06/19/2016 INDICATION: 82 year old female with new painless lower gastrointestinal hemorrhage, positive nuclear medicine bleeding scan, and hemodynamic instability. She has been referred for evaluation of angiogram and possible therapy. EXAM: SELECTIVE VISCERAL ARTERIOGRAPHY; IR ULTRASOUND GUIDANCE VASC ACCESS RIGHT; IR EMBO ART VEN HEMORR LYMPH EXTRAV INC GUIDE ROADMAPPING; ARTERIOGRAPHY; ADDITIONAL ARTERIOGRAPHY MEDICATIONS: None ANESTHESIA/SEDATION: Moderate (conscious) sedation was employed during this procedure. A total of Versed 1.5 mg and Fentanyl  62.5 mcg was administered intravenously. Moderate Sedation Time: 90 minutes. The patient's level of consciousness and vital signs were monitored continuously by radiology nursing throughout the procedure under my direct supervision. CONTRAST:  270 cc FLUOROSCOPY TIME:  Fluoroscopy Time: 27 minutes 36 seconds (74.9 mGy). COMPLICATIONS: None immediate. PROCEDURE: Informed consent was obtained from the patient following explanation of the procedure, risks, benefits and alternatives. The patient understands, agrees and consents for the procedure. All questions were addressed. A time out was performed prior to the initiation of the procedure. Maximal barrier sterile technique utilized including caps, mask, sterile gowns, sterile gloves, large sterile drape, hand hygiene, and Betadine prep. Ultrasound survey of the  right inguinal region was performed with images stored and sent to PACs. A micropuncture needle was used access the right common femoral artery under ultrasound. With excellent arterial blood flow returned, and an .018 micro wire was passed through the needle, observed enter the abdominal aorta under fluoroscopy. The needle was removed, and a micropuncture sheath was placed over the wire. The inner dilator and wire were removed, and an 035 Bentson wire was advanced under fluoroscopy into the abdominal aorta. The sheath was removed and a standard 5 [place] vascular sheath was placed. The dilator was removed and the sheath was flushed. Standard C2 Cobra catheter was advanced over the wire into the abdomen. Cobra catheter was used to select the superior mesenteric artery. Mesenteric artery angiogram was performed. Cobra catheter was then exchanged for an Omni Flush catheter. Lower aortic angiogram and pelvic angiogram performed. Variation of catheters used to select inferior mesenteric artery. CT cobra catheter, Chung 2.5 catheter, and Roche inferior mesenteric catheter were used. The rim catheter was successful in selecting the origin of the inferior mesenteric artery. IMA angiogram performed. Micro catheter system was then advanced through the rim catheter. 105 cm micro catheter and a combination of a 14 Fathom and a synchro soft were used for selective/sub selective angiogram of branches of the superior rectal artery. Once the catheter tip was confirmed to be within abnormal fourth order vessel of the superior rectal branches, coil embolization was performed with 2 mm x 2 cm and a 2 mm x 4 cm coil. Final angiogram performed. Angiogram of the right common femoral artery was performed. Exoseal was deployed for hemostasis. FINDINGS: Superior mesenteric artery angiogram demonstrates patent vasculature with no tumor blush, extravasation of contrast, or contrast stasis. Contribution through the middle colic artery to the  gastroepiploic artery. Lower aortic angiogram demonstrates mild atherosclerotic changes with no aneurysm or dissection. Moderate atherosclerotic changes of the right common iliac artery with mild atherosclerotic changes of the left common iliac artery. No asymmetric flow. Bilateral L5 lumbar arteries are patent. Bilateral hypogastric arteries patent. No significant atherosclerotic changes of the proximal femoral vasculature. IMA angiogram demonstrates venous shunting/contrast staining associated with fourth order branches of the superior rectal artery. Widely patent left colic artery with patent marginal artery of [place]. Status post coil embolization of abnormal fourth order branch with no ongoing contrast staining or venous shunting. IMPRESSION: Status post mesenteric artery angiogram and coil embolization of abnormal fourth order branch of superior rectal artery for treatment of acute lower GI hemorrhage. Deployment of Exoseal for right common femoral artery hemostasis. Signed, Dulcy Fanny. Earleen Newport, DO [facility] Radiology Electronically Signed   By: Corrie Mckusick D.O.   On: 06/19/2016 18:19   Ir Angiogram Visceral Selective  Result Date: 06/19/2016 INDICATION: 82 year old female with new painless lower gastrointestinal hemorrhage, positive nuclear  medicine bleeding scan, and hemodynamic instability. She has been referred for evaluation of angiogram and possible therapy. EXAM: SELECTIVE VISCERAL ARTERIOGRAPHY; IR ULTRASOUND GUIDANCE VASC ACCESS RIGHT; IR EMBO ART VEN HEMORR LYMPH EXTRAV INC GUIDE ROADMAPPING; ARTERIOGRAPHY; ADDITIONAL ARTERIOGRAPHY MEDICATIONS: None ANESTHESIA/SEDATION: Moderate (conscious) sedation was employed during this procedure. A total of Versed 1.5 mg and Fentanyl 62.5 mcg was administered intravenously. Moderate Sedation Time: 90 minutes. The patient's level of consciousness and vital signs were monitored continuously by radiology  nursing throughout the procedure under my direct supervision. CONTRAST:  270 cc FLUOROSCOPY TIME:  Fluoroscopy Time: 27 minutes 36 seconds (74.9 mGy). COMPLICATIONS: None immediate. PROCEDURE: Informed consent was obtained from the patient following explanation of the procedure, risks, benefits and alternatives. The patient understands, agrees and consents for the procedure. All questions were addressed. A time out was performed prior to the initiation of the procedure. Maximal barrier sterile technique utilized including caps, mask, sterile gowns, sterile gloves, large sterile drape, hand hygiene, and Betadine prep. Ultrasound survey of the right inguinal region was performed with images stored and sent to PACs. A micropuncture needle was used access the right common femoral artery under ultrasound. With excellent arterial blood flow returned, and an .018 micro wire was passed through the needle, observed enter the abdominal aorta under fluoroscopy. The needle was removed, and a micropuncture sheath was placed over the wire. The inner dilator and wire were removed, and an 035 Bentson wire was advanced under fluoroscopy into the abdominal aorta. The sheath was removed and a standard 5 [place] vascular sheath was placed. The dilator was removed and the sheath was flushed. Standard C2 Cobra catheter was advanced over the wire into the abdomen. Cobra catheter was used to select the superior mesenteric artery. Mesenteric artery angiogram was performed. Cobra catheter was then exchanged for an Omni Flush catheter. Lower aortic angiogram and pelvic angiogram performed. Variation of catheters used to select inferior mesenteric artery. CT cobra catheter, Chung 2.5 catheter, and Roche inferior mesenteric catheter were used. The rim catheter was successful in selecting the origin of the inferior mesenteric artery. IMA angiogram performed. Micro catheter system was then advanced through the rim catheter. 105 cm micro catheter  and a combination of a 14 Fathom and a synchro soft were used for selective/sub selective angiogram of branches of the superior rectal artery. Once the catheter tip was confirmed to be within abnormal fourth order vessel of the superior rectal branches, coil embolization was performed with 2 mm x 2 cm and a 2 mm x 4 cm coil. Final angiogram performed. Angiogram of the right common femoral artery was performed. Exoseal was deployed for hemostasis. FINDINGS: Superior mesenteric artery angiogram demonstrates patent vasculature with no tumor blush, extravasation of contrast, or contrast stasis. Contribution through the middle colic artery to the gastroepiploic artery. Lower aortic angiogram demonstrates mild atherosclerotic changes with no aneurysm or dissection. Moderate atherosclerotic changes of the right common iliac artery with mild atherosclerotic changes of the left common iliac artery. No asymmetric flow. Bilateral L5 lumbar arteries are patent. Bilateral hypogastric arteries patent. No significant atherosclerotic changes of the proximal femoral vasculature. IMA angiogram demonstrates venous shunting/contrast staining associated with fourth order branches of the superior rectal artery. Widely patent left colic artery with patent marginal artery of [place]. Status post coil embolization of abnormal fourth order branch with no ongoing contrast staining or venous shunting. IMPRESSION: Status post mesenteric artery angiogram and coil embolization of abnormal fourth order branch of superior rectal artery for treatment of acute  lower GI hemorrhage. Deployment of Exoseal for right common femoral artery hemostasis. Signed, Dulcy Fanny. Earleen Newport, DO [facility] Radiology Electronically Signed   By: Corrie Mckusick D.O.   On: 06/19/2016 18:19   Ir Angiogram Selective Each Additional Vessel  Result Date: 06/19/2016 INDICATION: 82 year old female with new painless lower  gastrointestinal hemorrhage, positive nuclear medicine bleeding scan, and hemodynamic instability. She has been referred for evaluation of angiogram and possible therapy. EXAM: SELECTIVE VISCERAL ARTERIOGRAPHY; IR ULTRASOUND GUIDANCE VASC ACCESS RIGHT; IR EMBO ART VEN HEMORR LYMPH EXTRAV INC GUIDE ROADMAPPING; ARTERIOGRAPHY; ADDITIONAL ARTERIOGRAPHY MEDICATIONS: None ANESTHESIA/SEDATION: Moderate (conscious) sedation was employed during this procedure. A total of Versed 1.5 mg and Fentanyl 62.5 mcg was administered intravenously. Moderate Sedation Time: 90 minutes. The patient's level of consciousness and vital signs were monitored continuously by radiology nursing throughout the procedure under my direct supervision. CONTRAST:  270 cc FLUOROSCOPY TIME:  Fluoroscopy Time: 27 minutes 36 seconds (74.9 mGy). COMPLICATIONS: None immediate. PROCEDURE: Informed consent was obtained from the patient following explanation of the procedure, risks, benefits and alternatives. The patient understands, agrees and consents for the procedure. All questions were addressed. A time out was performed prior to the initiation of the procedure. Maximal barrier sterile technique utilized including caps, mask, sterile gowns, sterile gloves, large sterile drape, hand hygiene, and Betadine prep. Ultrasound survey of the right inguinal region was performed with images stored and sent to PACs. A micropuncture needle was used access the right common femoral artery under ultrasound. With excellent arterial blood flow returned, and an .018 micro wire was passed through the needle, observed enter the abdominal aorta under fluoroscopy. The needle was removed, and a micropuncture sheath was placed over the wire. The inner dilator and wire were removed, and an 035 Bentson wire was advanced under fluoroscopy into the abdominal aorta. The sheath was removed and a standard 5 [place] vascular sheath was placed. The dilator was removed and the sheath was  flushed. Standard C2 Cobra catheter was advanced over the wire into the abdomen. Cobra catheter was used to select the superior mesenteric artery. Mesenteric artery angiogram was performed. Cobra catheter was then exchanged for an Omni Flush catheter. Lower aortic angiogram and pelvic angiogram performed. Variation of catheters used to select inferior mesenteric artery. CT cobra catheter, Chung 2.5 catheter, and Roche inferior mesenteric catheter were used. The rim catheter was successful in selecting the origin of the inferior mesenteric artery. IMA angiogram performed. Micro catheter system was then advanced through the rim catheter. 105 cm micro catheter and a combination of a 14 Fathom and a synchro soft were used for selective/sub selective angiogram of branches of the superior rectal artery. Once the catheter tip was confirmed to be within abnormal fourth order vessel of the superior rectal branches, coil embolization was performed with 2 mm x 2 cm and a 2 mm x 4 cm coil. Final angiogram performed. Angiogram of the right common femoral artery was performed. Exoseal was deployed for hemostasis. FINDINGS: Superior mesenteric artery angiogram demonstrates patent vasculature with no tumor blush, extravasation of contrast, or contrast stasis. Contribution through the middle colic artery to the gastroepiploic artery. Lower aortic angiogram demonstrates mild atherosclerotic changes with no aneurysm or dissection. Moderate atherosclerotic changes of the right common iliac artery with mild atherosclerotic changes of the left common iliac artery. No asymmetric flow. Bilateral L5 lumbar arteries are patent. Bilateral hypogastric arteries patent. No significant atherosclerotic changes of the proximal femoral vasculature. IMA angiogram demonstrates venous shunting/contrast  staining associated with fourth order branches of the superior rectal artery. Widely patent left colic artery with patent marginal artery of [place].  Status post coil embolization of abnormal fourth order branch with no ongoing contrast staining or venous shunting. IMPRESSION: Status post mesenteric artery angiogram and coil embolization of abnormal fourth order branch of superior rectal artery for treatment of acute lower GI hemorrhage. Deployment of Exoseal for right common femoral artery hemostasis. Signed, Dulcy Fanny. Earleen Newport, DO [facility] Radiology Electronically Signed   By: Corrie Mckusick D.O.   On: 06/19/2016 18:19   Ir Angiogram Selective Each Additional Vessel  Result Date: 06/19/2016 INDICATION: 82 year old female with new painless lower gastrointestinal hemorrhage, positive nuclear medicine bleeding scan, and hemodynamic instability. She has been referred for evaluation of angiogram and possible therapy. EXAM: SELECTIVE VISCERAL ARTERIOGRAPHY; IR ULTRASOUND GUIDANCE VASC ACCESS RIGHT; IR EMBO ART VEN HEMORR LYMPH EXTRAV INC GUIDE ROADMAPPING; ARTERIOGRAPHY; ADDITIONAL ARTERIOGRAPHY MEDICATIONS: None ANESTHESIA/SEDATION: Moderate (conscious) sedation was employed during this procedure. A total of Versed 1.5 mg and Fentanyl 62.5 mcg was administered intravenously. Moderate Sedation Time: 90 minutes. The patient's level of consciousness and vital signs were monitored continuously by radiology nursing throughout the procedure under my direct supervision. CONTRAST:  270 cc FLUOROSCOPY TIME:  Fluoroscopy Time: 27 minutes 36 seconds (74.9 mGy). COMPLICATIONS: None immediate. PROCEDURE: Informed consent was obtained from the patient following explanation of the procedure, risks, benefits and alternatives. The patient understands, agrees and consents for the procedure. All questions were addressed. A time out was performed prior to the initiation of the procedure. Maximal barrier sterile technique utilized including caps, mask, sterile gowns, sterile gloves, large sterile drape, hand hygiene, and Betadine  prep. Ultrasound survey of the right inguinal region was performed with images stored and sent to PACs. A micropuncture needle was used access the right common femoral artery under ultrasound. With excellent arterial blood flow returned, and an .018 micro wire was passed through the needle, observed enter the abdominal aorta under fluoroscopy. The needle was removed, and a micropuncture sheath was placed over the wire. The inner dilator and wire were removed, and an 035 Bentson wire was advanced under fluoroscopy into the abdominal aorta. The sheath was removed and a standard 5 [place] vascular sheath was placed. The dilator was removed and the sheath was flushed. Standard C2 Cobra catheter was advanced over the wire into the abdomen. Cobra catheter was used to select the superior mesenteric artery. Mesenteric artery angiogram was performed. Cobra catheter was then exchanged for an Omni Flush catheter. Lower aortic angiogram and pelvic angiogram performed. Variation of catheters used to select inferior mesenteric artery. CT cobra catheter, Chung 2.5 catheter, and Roche inferior mesenteric catheter were used. The rim catheter was successful in selecting the origin of the inferior mesenteric artery. IMA angiogram performed. Micro catheter system was then advanced through the rim catheter. 105 cm micro catheter and a combination of a 14 Fathom and a synchro soft were used for selective/sub selective angiogram of branches of the superior rectal artery. Once the catheter tip was confirmed to be within abnormal fourth order vessel of the superior rectal branches, coil embolization was performed with 2 mm x 2 cm and a 2 mm x 4 cm coil. Final angiogram performed. Angiogram of the right common femoral artery was performed. Exoseal was deployed for hemostasis. FINDINGS: Superior mesenteric artery angiogram demonstrates patent vasculature with no tumor blush, extravasation of contrast, or contrast stasis. Contribution through  the middle colic artery  to the gastroepiploic artery. Lower aortic angiogram demonstrates mild atherosclerotic changes with no aneurysm or dissection. Moderate atherosclerotic changes of the right common iliac artery with mild atherosclerotic changes of the left common iliac artery. No asymmetric flow. Bilateral L5 lumbar arteries are patent. Bilateral hypogastric arteries patent. No significant atherosclerotic changes of the proximal femoral vasculature. IMA angiogram demonstrates venous shunting/contrast staining associated with fourth order branches of the superior rectal artery. Widely patent left colic artery with patent marginal artery of [place]. Status post coil embolization of abnormal fourth order branch with no ongoing contrast staining or venous shunting. IMPRESSION: Status post mesenteric artery angiogram and coil embolization of abnormal fourth order branch of superior rectal artery for treatment of acute lower GI hemorrhage. Deployment of Exoseal for right common femoral artery hemostasis. Signed, Dulcy Fanny. Earleen Newport, DO [facility] Electronically Signed   By: Corrie Mckusick D.O.   On: 06/19/2016 18:19   Ir Angiogram Follow Up Study  Result Date: 06/19/2016 INDICATION: 82 year old female with new painless lower gastrointestinal hemorrhage, positive nuclear medicine bleeding scan, and hemodynamic instability. She has been referred for evaluation of angiogram and possible therapy. EXAM: SELECTIVE VISCERAL ARTERIOGRAPHY; IR ULTRASOUND GUIDANCE VASC ACCESS RIGHT; IR EMBO ART VEN HEMORR LYMPH EXTRAV INC GUIDE ROADMAPPING; ARTERIOGRAPHY; ADDITIONAL ARTERIOGRAPHY MEDICATIONS: None ANESTHESIA/SEDATION: Moderate (conscious) sedation was employed during this procedure. A total of Versed 1.5 mg and Fentanyl 62.5 mcg was administered intravenously. Moderate Sedation Time: 90 minutes. The patient's level of consciousness and vital signs were monitored  continuously by radiology nursing throughout the procedure under my direct supervision. CONTRAST:  270 cc FLUOROSCOPY TIME:  Fluoroscopy Time: 27 minutes 36 seconds (74.9 mGy). COMPLICATIONS: None immediate. PROCEDURE: Informed consent was obtained from the patient following explanation of the procedure, risks, benefits and alternatives. The patient understands, agrees and consents for the procedure. All questions were addressed. A time out was performed prior to the initiation of the procedure. Maximal barrier sterile technique utilized including caps, mask, sterile gowns, sterile gloves, large sterile drape, hand hygiene, and Betadine prep. Ultrasound survey of the right inguinal region was performed with images stored and sent to PACs. A micropuncture needle was used access the right common femoral artery under ultrasound. With excellent arterial blood flow returned, and an .018 micro wire was passed through the needle, observed enter the abdominal aorta under fluoroscopy. The needle was removed, and a micropuncture sheath was placed over the wire. The inner dilator and wire were removed, and an 035 Bentson wire was advanced under fluoroscopy into the abdominal aorta. The sheath was removed and a standard 5 [place] vascular sheath was placed. The dilator was removed and the sheath was flushed. Standard C2 Cobra catheter was advanced over the wire into the abdomen. Cobra catheter was used to select the superior mesenteric artery. Mesenteric artery angiogram was performed. Cobra catheter was then exchanged for an Omni Flush catheter. Lower aortic angiogram and pelvic angiogram performed. Variation of catheters used to select inferior mesenteric artery. CT cobra catheter, Chung 2.5 catheter, and Roche inferior mesenteric catheter were used. The rim catheter was successful in selecting the origin of the inferior mesenteric artery. IMA angiogram performed. Micro catheter system was then advanced through the rim  catheter. 105 cm micro catheter and a combination of a 14 Fathom and a synchro soft were used for selective/sub selective angiogram of branches of the superior rectal artery. Once the catheter tip was confirmed to be within abnormal fourth order vessel of the superior rectal  branches, coil embolization was performed with 2 mm x 2 cm and a 2 mm x 4 cm coil. Final angiogram performed. Angiogram of the right common femoral artery was performed. Exoseal was deployed for hemostasis. FINDINGS: Superior mesenteric artery angiogram demonstrates patent vasculature with no tumor blush, extravasation of contrast, or contrast stasis. Contribution through the middle colic artery to the gastroepiploic artery. Lower aortic angiogram demonstrates mild atherosclerotic changes with no aneurysm or dissection. Moderate atherosclerotic changes of the right common iliac artery with mild atherosclerotic changes of the left common iliac artery. No asymmetric flow. Bilateral L5 lumbar arteries are patent. Bilateral hypogastric arteries patent. No significant atherosclerotic changes of the proximal femoral vasculature. IMA angiogram demonstrates venous shunting/contrast staining associated with fourth order branches of the superior rectal artery. Widely patent left colic artery with patent marginal artery of [place]. Status post coil embolization of abnormal fourth order branch with no ongoing contrast staining or venous shunting. IMPRESSION: Status post mesenteric artery angiogram and coil embolization of abnormal fourth order branch of superior rectal artery for treatment of acute lower GI hemorrhage. Deployment of Exoseal for right common femoral artery hemostasis. Signed, Dulcy Fanny. Earleen Newport, DO [facility] Rad[facility] Electronically Signed   By: Corrie Mckusick D.O.   On: 06/19/2016 18:19   Ir US Guide Vasc Access Right  Result Date: 06/19/2016 INDICATION: 82 year old female with new  painless lower gastrointestinal hemorrhage, positive nuclear medicine bleeding scan, and hemodynamic instability. She has been referred for evaluation of angiogram and possible therapy. EXAM: SELECTIVE VISCERAL ARTERIOGRAPHY; IR ULTRASOUND GUIDANCE VASC ACCESS RIGHT; IR EMBO ART VEN HEMORR LYMPH EXTRAV INC GUIDE ROADMAPPING; ARTERIOGRAPHY; ADDITIONAL ARTERIOGRAPHY MEDICATIONS: None ANESTHESIA/SEDATION: Moderate (conscious) sedation was employed during this procedure. A total of Versed 1.5 mg and Fentanyl 62.5 mcg was administered intravenously. Moderate Sedation Time: 90 minutes. The patient's level of consciousness and vital signs were monitored continuously by radiology nursing throughout the procedure under my direct supervision. CONTRAST:  270 cc FLUOROSCOPY TIME:  Fluoroscopy Time: 27 minutes 36 seconds (74.9 mGy). COMPLICATIONS: None immediate. PROCEDURE: Informed consent was obtained from the patient following explanation of the procedure, risks, benefits and alternatives. The patient understands, agrees and consents for the procedure. All questions were addressed. A time out was performed prior to the initiation of the procedure. Maximal barrier sterile technique utilized including caps, mask, sterile gowns, sterile gloves, large sterile drape, hand hygiene, and Betadine prep. Ultrasound survey of the right inguinal region was performed with images stored and sent to PACs. A micropuncture needle was used access the right common femoral artery under ultrasound. With excellent arterial blood flow returned, and an .018 micro wire was passed through the needle, observed enter the abdominal aorta under fluoroscopy. The needle was removed, and a micropuncture sheath was placed over the wire. The inner dilator and wire were removed, and an 035 Bentson wire was advanced under fluoroscopy into the abdominal aorta. The sheath was removed and a standard 5 [place] vascular sheath was placed. The dilator was removed and  the sheath was flushed. Standard C2 Cobra catheter was advanced over the wire into the abdomen. Cobra catheter was used to select the superior mesenteric artery. Mesenteric artery angiogram was performed. Cobra catheter was then exchanged for an Omni Flush catheter. Lower aortic angiogram and pelvic angiogram performed. Variation of catheters used to select inferior mesenteric artery. CT cobra catheter, Chung 2.5 catheter, and Roche inferior mesenteric catheter were used. The rim catheter was successful in selecting the origin of the  inferior mesenteric artery. IMA angiogram performed. Micro catheter system was then advanced through the rim catheter. 105 cm micro catheter and a combination of a 14 Fathom and a synchro soft were used for selective/sub selective angiogram of branches of the superior rectal artery. Once the catheter tip was confirmed to be within abnormal fourth order vessel of the superior rectal branches, coil embolization was performed with 2 mm x 2 cm and a 2 mm x 4 cm coil. Final angiogram performed. Angiogram of the right common femoral artery was performed. Exoseal was deployed for hemostasis. FINDINGS: Superior mesenteric artery angiogram demonstrates patent vasculature with no tumor blush, extravasation of contrast, or contrast stasis. Contribution through the middle colic artery to the gastroepiploic artery. Lower aortic angiogram demonstrates mild atherosclerotic changes with no aneurysm or dissection. Moderate atherosclerotic changes of the right common iliac artery with mild atherosclerotic changes of the left common iliac artery. No asymmetric flow. Bilateral L5 lumbar arteries are patent. Bilateral hypogastric arteries patent. No significant atherosclerotic changes of the proximal femoral vasculature. IMA angiogram demonstrates venous shunting/contrast staining associated with fourth order branches of the superior rectal artery. Widely patent left colic artery with patent marginal artery  of [place]. Status post coil embolization of abnormal fourth order branch with no ongoing contrast staining or venous shunting. IMPRESSION: Status post mesenteric artery angiogram and coil embolization of abnormal fourth order branch of superior rectal artery for treatment of acute lower GI hemorrhage. Deployment of Exoseal for right common femoral artery hemostasis. Signed, Dulcy Fanny. Earleen Newport, DO [facility] Radiology Electronically Signed   By: Corrie Mckusick D.O.   On: 06/19/2016 18:19   Ir Embo Art  Collins Guide Roadmapping  Result Date: 06/19/2016 INDICATION: 82 year old female with new painless lower gastrointestinal hemorrhage, positive nuclear medicine bleeding scan, and hemodynamic instability. She has been referred for evaluation of angiogram and possible therapy. EXAM: SELECTIVE VISCERAL ARTERIOGRAPHY; IR ULTRASOUND GUIDANCE VASC ACCESS RIGHT; IR EMBO ART VEN HEMORR LYMPH EXTRAV INC GUIDE ROADMAPPING; ARTERIOGRAPHY; ADDITIONAL ARTERIOGRAPHY MEDICATIONS: None ANESTHESIA/SEDATION: Moderate (conscious) sedation was employed during this procedure. A total of Versed 1.5 mg and Fentanyl 62.5 mcg was administered intravenously. Moderate Sedation Time: 90 minutes. The patient's level of consciousness and vital signs were monitored continuously by radiology nursing throughout the procedure under my direct supervision. CONTRAST:  270 cc FLUOROSCOPY TIME:  Fluoroscopy Time: 27 minutes 36 seconds (74.9 mGy). COMPLICATIONS: None immediate. PROCEDURE: Informed consent was obtained from the patient following explanation of the procedure, risks, benefits and alternatives. The patient understands, agrees and consents for the procedure. All questions were addressed. A time out was performed prior to the initiation of the procedure. Maximal barrier sterile technique utilized including caps, mask, sterile gowns, sterile gloves, large sterile drape, hand  hygiene, and Betadine prep. Ultrasound survey of the right inguinal region was performed with images stored and sent to PACs. A micropuncture needle was used access the right common femoral artery under ultrasound. With excellent arterial blood flow returned, and an .018 micro wire was passed through the needle, observed enter the abdominal aorta under fluoroscopy. The needle was removed, and a micropuncture sheath was placed over the wire. The inner dilator and wire were removed, and an 035 Bentson wire was advanced under fluoroscopy into the abdominal aorta. The sheath was removed and a standard 5 [place] vascular sheath was placed. The dilator was removed and the sheath was flushed. Standard C2 Cobra catheter was advanced over the wire into  the abdomen. Cobra catheter was used to select the superior mesenteric artery. Mesenteric artery angiogram was performed. Cobra catheter was then exchanged for an Omni Flush catheter. Lower aortic angiogram and pelvic angiogram performed. Variation of catheters used to select inferior mesenteric artery. CT cobra catheter, Chung 2.5 catheter, and Roche inferior mesenteric catheter were used. The rim catheter was successful in selecting the origin of the inferior mesenteric artery. IMA angiogram performed. Micro catheter system was then advanced through the rim catheter. 105 cm micro catheter and a combination of a 14 Fathom and a synchro soft were used for selective/sub selective angiogram of branches of the superior rectal artery. Once the catheter tip was confirmed to be within abnormal fourth order vessel of the superior rectal branches, coil embolization was performed with 2 mm x 2 cm and a 2 mm x 4 cm coil. Final angiogram performed. Angiogram of the right common femoral artery was performed. Exoseal was deployed for hemostasis. FINDINGS: Superior mesenteric artery angiogram demonstrates patent vasculature with no tumor blush, extravasation of contrast, or contrast stasis.  Contribution through the middle colic artery to the gastroepiploic artery. Lower aortic angiogram demonstrates mild atherosclerotic changes with no aneurysm or dissection. Moderate atherosclerotic changes of the right common iliac artery with mild atherosclerotic changes of the left common iliac artery. No asymmetric flow. Bilateral L5 lumbar arteries are patent. Bilateral hypogastric arteries patent. No significant atherosclerotic changes of the proximal femoral vasculature. IMA angiogram demonstrates venous shunting/contrast staining associated with fourth order branches of the superior rectal artery. Widely patent left colic artery with patent marginal artery of [place]. Status post coil embolization of abnormal fourth order branch with no ongoing contrast staining or venous shunting. IMPRESSION: Status post mesenteric artery angiogram and coil embolization of abnormal fourth order branch of superior rectal artery for treatment of acute lower GI hemorrhage. Deployment of Exoseal for right common femoral artery hemostasis. Signed, Dulcy Fanny. Earleen Newport, DO [facility] Radiology Electronically Signed   By: Corrie Mckusick D.O.   On: 06/19/2016 18:19    Meldrick Buttery, DO  [facility] Pager [phone]  If 7PM-7AM, please contact night-coverage [email or web address] Password [email or web address] 06/20/2016, 7:12 PM   LOS: 2 days

## 2016-06-20 NOTE — Progress Notes (Signed)
CRITICAL VALUE ALERT  Critical value received:  Hemoglobin 6.0  Date of notification:  06/20/2016  Time of notification:  0617  Critical value read back:Yes.    Nurse who received alert:  Kendrick Ranch  MD notified (1st page):  Raliegh Ip Schorr  Time of first page:  0617  MD notified (2nd page):  Time of second page:  Responding MD:  awaiting  Time MD responded:  awaiting

## 2016-06-20 NOTE — Progress Notes (Signed)
  Echocardiogram 2D Echocardiogram has been performed.  Alicia Gomez 06/20/2016, 3:21 PM

## 2016-06-20 NOTE — Progress Notes (Signed)
Referring Physician(s): Dr. Anson Fret   Supervising Physician: Aletta Edouard  Patient Status:  [facility] - In-pt  Chief Complaint:  GI bleed Status post mesenteric artery angiogram and coil embolization of abnormal fourth order branch of superior rectal artery for treatment of acute lower GI hemorrhage.  Subjective:  Alicia Gomez is doing well today. She denies any further bleeding episodes. She is currently getting tranfused with PRBCs  Allergies: Codeine  Medications: Prior to Admission medications   Medication Sig Start Date End Date Taking? Authorizing Provider  acetaminophen (TYLENOL 8 HOUR) 650 MG CR tablet Take 1 tablet (650 mg total) by mouth every 8 (eight) hours as needed for pain. 09/08/15  Yes Varney Biles, MD  aspirin 81 MG tablet Take 81 mg by mouth daily.     Yes Historical Provider, MD  bimatoprost (LUMIGAN) 0.03 % ophthalmic drops Place 1 drop into both eyes at bedtime.    Yes Historical Provider, MD  carvedilol (COREG) 25 MG tablet Take 12.5 mg by mouth 2 (two) times daily with a meal.     Yes Historical Provider, MD  Cholecalciferol (VITAMIN D PO) Take 1 tablet by mouth 2 (two) times daily.    Yes Historical Provider, MD  Cinnamon 500 MG TABS Take 1 tablet by mouth 1 day or 1 dose.   Yes Historical Provider, MD  gabapentin (NEURONTIN) 100 MG capsule Take 100 mg by mouth 2 (two) times daily.   Yes Historical Provider, MD  meclizine (ANTIVERT) 25 MG tablet Take 25 mg by mouth 2 (two) times daily.    Yes Historical Provider, MD  timolol (BETIMOL) 0.5 % ophthalmic solution Place 1 drop into both eyes 2 (two) times daily.    Yes Historical Provider, MD     Vital Signs: BP 119/66   Pulse 82   Temp 98.6 F (37 C) (Oral)   Resp 18   Ht 4\' 2"  (1.27 m)   Wt 88 lb (39.9 kg)   SpO2 100%   BMI 24.75 kg/m   Physical Exam Awake and alert NAD Abdomen soft, NT Right groin stick ok, no hematoma, no pseudoaneurysm  Imaging: Nm Gi Blood Loss  Result Date:  06/19/2016 CLINICAL DATA:  GI bleed. EXAM: NUCLEAR MEDICINE GASTROINTESTINAL BLEEDING SCAN TECHNIQUE: Sequential abdominal images were obtained following intravenous administration of Tc-27m labeled red blood cells. RADIOPHARMACEUTICALS:  25.7 mCi Tc-72m in-vitro labeled red cells. COMPARISON:  None. FINDINGS: The study is positive for a GI bleed beginning in the left lower abdomen/ upper pelvis. The patient was very agitated with significant motion precluding further imaging. However, the bleed appears to originate in the descending colon. IMPRESSION: GI bleed beginning in the left lower abdomen/ upper pelvis, thought to originate in the descending colon. These results will be called to the ordering clinician or representative by the Radiologist Assistant, and communication documented in the PACS or zVision Dashboard. Electronically Signed   By: Dorise Bullion III M.D   On: 06/19/2016 13:00   Ir Angiogram Visceral Selective  Result Date: 06/19/2016 INDICATION: 82 year old female with new painless lower gastrointestinal hemorrhage, positive nuclear medicine bleeding scan, and hemodynamic instability. She has been referred for evaluation of angiogram and possible therapy. EXAM: SELECTIVE VISCERAL ARTERIOGRAPHY; IR ULTRASOUND GUIDANCE VASC ACCESS RIGHT; IR EMBO ART VEN HEMORR LYMPH EXTRAV INC GUIDE ROADMAPPING; ARTERIOGRAPHY; ADDITIONAL ARTERIOGRAPHY MEDICATIONS: None ANESTHESIA/SEDATION: Moderate (conscious) sedation was employed during this procedure. A total of Versed 1.5 mg and Fentanyl 62.5 mcg was administered intravenously. Moderate Sedation Time: 90 minutes. The  patient's level of consciousness and vital signs were monitored continuously by radiology nursing throughout the procedure under my direct supervision. CONTRAST:  270 cc FLUOROSCOPY TIME:  Fluoroscopy Time: 27 minutes 36 seconds (74.9 mGy). COMPLICATIONS: None immediate. PROCEDURE: Informed consent was obtained from the patient following  explanation of the procedure, risks, benefits and alternatives. The patient understands, agrees and consents for the procedure. All questions were addressed. A time out was performed prior to the initiation of the procedure. Maximal barrier sterile technique utilized including caps, mask, sterile gowns, sterile gloves, large sterile drape, hand hygiene, and Betadine prep. Ultrasound survey of the right inguinal region was performed with images stored and sent to PACs. A micropuncture needle was used access the right common femoral artery under ultrasound. With excellent arterial blood flow returned, and an .018 micro wire was passed through the needle, observed enter the abdominal aorta under fluoroscopy. The needle was removed, and a micropuncture sheath was placed over the wire. The inner dilator and wire were removed, and an 035 Bentson wire was advanced under fluoroscopy into the abdominal aorta. The sheath was removed and a standard 5 [place] vascular sheath was placed. The dilator was removed and the sheath was flushed. Standard C2 Cobra catheter was advanced over the wire into the abdomen. Cobra catheter was used to select the superior mesenteric artery. Mesenteric artery angiogram was performed. Cobra catheter was then exchanged for an Omni Flush catheter. Lower aortic angiogram and pelvic angiogram performed. Variation of catheters used to select inferior mesenteric artery. CT cobra catheter, Chung 2.5 catheter, and Roche inferior mesenteric catheter were used. The rim catheter was successful in selecting the origin of the inferior mesenteric artery. IMA angiogram performed. Micro catheter system was then advanced through the rim catheter. 105 cm micro catheter and a combination of a 14 Fathom and a synchro soft were used for selective/sub selective angiogram of branches of the superior rectal artery. Once the catheter tip was confirmed to be within abnormal fourth order vessel of the superior rectal  branches, coil embolization was performed with 2 mm x 2 cm and a 2 mm x 4 cm coil. Final angiogram performed. Angiogram of the right common femoral artery was performed. Exoseal was deployed for hemostasis. FINDINGS: Superior mesenteric artery angiogram demonstrates patent vasculature with no tumor blush, extravasation of contrast, or contrast stasis. Contribution through the middle colic artery to the gastroepiploic artery. Lower aortic angiogram demonstrates mild atherosclerotic changes with no aneurysm or dissection. Moderate atherosclerotic changes of the right common iliac artery with mild atherosclerotic changes of the left common iliac artery. No asymmetric flow. Bilateral L5 lumbar arteries are patent. Bilateral hypogastric arteries patent. No significant atherosclerotic changes of the proximal femoral vasculature. IMA angiogram demonstrates venous shunting/contrast staining associated with fourth order branches of the superior rectal artery. Widely patent left colic artery with patent marginal artery of [place]. Status post coil embolization of abnormal fourth order branch with no ongoing contrast staining or venous shunting. IMPRESSION: Status post mesenteric artery angiogram and coil embolization of abnormal fourth order branch of superior rectal artery for treatment of acute lower GI hemorrhage. Deployment of Exoseal for right common femoral artery hemostasis. Signed, Dulcy Fanny. Earleen Newport, DO [facility] Radiology Electronically Signed   By: Corrie Mckusick D.O.   On: 06/19/2016 18:19   Ir Angiogram Visceral Selective  Result Date: 06/19/2016 INDICATION: 82 year old female with new painless lower gastrointestinal hemorrhage, positive nuclear medicine bleeding scan, and hemodynamic instability. She has been referred for  evaluation of angiogram and possible therapy. EXAM: SELECTIVE VISCERAL ARTERIOGRAPHY; IR ULTRASOUND GUIDANCE VASC ACCESS RIGHT; IR EMBO ART  VEN HEMORR LYMPH EXTRAV INC GUIDE ROADMAPPING; ARTERIOGRAPHY; ADDITIONAL ARTERIOGRAPHY MEDICATIONS: None ANESTHESIA/SEDATION: Moderate (conscious) sedation was employed during this procedure. A total of Versed 1.5 mg and Fentanyl 62.5 mcg was administered intravenously. Moderate Sedation Time: 90 minutes. The patient's level of consciousness and vital signs were monitored continuously by radiology nursing throughout the procedure under my direct supervision. CONTRAST:  270 cc FLUOROSCOPY TIME:  Fluoroscopy Time: 27 minutes 36 seconds (74.9 mGy). COMPLICATIONS: None immediate. PROCEDURE: Informed consent was obtained from the patient following explanation of the procedure, risks, benefits and alternatives. The patient understands, agrees and consents for the procedure. All questions were addressed. A time out was performed prior to the initiation of the procedure. Maximal barrier sterile technique utilized including caps, mask, sterile gowns, sterile gloves, large sterile drape, hand hygiene, and Betadine prep. Ultrasound survey of the right inguinal region was performed with images stored and sent to PACs. A micropuncture needle was used access the right common femoral artery under ultrasound. With excellent arterial blood flow returned, and an .018 micro wire was passed through the needle, observed enter the abdominal aorta under fluoroscopy. The needle was removed, and a micropuncture sheath was placed over the wire. The inner dilator and wire were removed, and an 035 Bentson wire was advanced under fluoroscopy into the abdominal aorta. The sheath was removed and a standard 5 [place] vascular sheath was placed. The dilator was removed and the sheath was flushed. Standard C2 Cobra catheter was advanced over the wire into the abdomen. Cobra catheter was used to select the superior mesenteric artery. Mesenteric artery angiogram was performed. Cobra catheter was then exchanged for an Omni Flush catheter. Lower aortic  angiogram and pelvic angiogram performed. Variation of catheters used to select inferior mesenteric artery. CT cobra catheter, Chung 2.5 catheter, and Roche inferior mesenteric catheter were used. The rim catheter was successful in selecting the origin of the inferior mesenteric artery. IMA angiogram performed. Micro catheter system was then advanced through the rim catheter. 105 cm micro catheter and a combination of a 14 Fathom and a synchro soft were used for selective/sub selective angiogram of branches of the superior rectal artery. Once the catheter tip was confirmed to be within abnormal fourth order vessel of the superior rectal branches, coil embolization was performed with 2 mm x 2 cm and a 2 mm x 4 cm coil. Final angiogram performed. Angiogram of the right common femoral artery was performed. Exoseal was deployed for hemostasis. FINDINGS: Superior mesenteric artery angiogram demonstrates patent vasculature with no tumor blush, extravasation of contrast, or contrast stasis. Contribution through the middle colic artery to the gastroepiploic artery. Lower aortic angiogram demonstrates mild atherosclerotic changes with no aneurysm or dissection. Moderate atherosclerotic changes of the right common iliac artery with mild atherosclerotic changes of the left common iliac artery. No asymmetric flow. Bilateral L5 lumbar arteries are patent. Bilateral hypogastric arteries patent. No significant atherosclerotic changes of the proximal femoral vasculature. IMA angiogram demonstrates venous shunting/contrast staining associated with fourth order branches of the superior rectal artery. Widely patent left colic artery with patent marginal artery of [place]. Status post coil embolization of abnormal fourth order branch with no ongoing contrast staining or venous shunting. IMPRESSION: Status post mesenteric artery angiogram and coil embolization of abnormal fourth order branch of superior rectal artery for treatment of  acute lower GI hemorrhage. Deployment of Exoseal for right common femoral  artery hemostasis. Signed, Dulcy Fanny. Earleen Newport, DO [facility] Radiology Electronically Signed   By: Corrie Mckusick D.O.   On: 06/19/2016 18:19   Ir Angiogram Selective Each Additional Vessel  Result Date: 06/19/2016 INDICATION: 82 year old female with new painless lower gastrointestinal hemorrhage, positive nuclear medicine bleeding scan, and hemodynamic instability. She has been referred for evaluation of angiogram and possible therapy. EXAM: SELECTIVE VISCERAL ARTERIOGRAPHY; IR ULTRASOUND GUIDANCE VASC ACCESS RIGHT; IR EMBO ART VEN HEMORR LYMPH EXTRAV INC GUIDE ROADMAPPING; ARTERIOGRAPHY; ADDITIONAL ARTERIOGRAPHY MEDICATIONS: None ANESTHESIA/SEDATION: Moderate (conscious) sedation was employed during this procedure. A total of Versed 1.5 mg and Fentanyl 62.5 mcg was administered intravenously. Moderate Sedation Time: 90 minutes. The patient's level of consciousness and vital signs were monitored continuously by radiology nursing throughout the procedure under my direct supervision. CONTRAST:  270 cc FLUOROSCOPY TIME:  Fluoroscopy Time: 27 minutes 36 seconds (74.9 mGy). COMPLICATIONS: None immediate. PROCEDURE: Informed consent was obtained from the patient following explanation of the procedure, risks, benefits and alternatives. The patient understands, agrees and consents for the procedure. All questions were addressed. A time out was performed prior to the initiation of the procedure. Maximal barrier sterile technique utilized including caps, mask, sterile gowns, sterile gloves, large sterile drape, hand hygiene, and Betadine prep. Ultrasound survey of the right inguinal region was performed with images stored and sent to PACs. A micropuncture needle was used access the right common femoral artery under ultrasound. With excellent arterial blood flow returned, and an .018 micro wire was  passed through the needle, observed enter the abdominal aorta under fluoroscopy. The needle was removed, and a micropuncture sheath was placed over the wire. The inner dilator and wire were removed, and an 035 Bentson wire was advanced under fluoroscopy into the abdominal aorta. The sheath was removed and a standard 5 [place] vascular sheath was placed. The dilator was removed and the sheath was flushed. Standard C2 Cobra catheter was advanced over the wire into the abdomen. Cobra catheter was used to select the superior mesenteric artery. Mesenteric artery angiogram was performed. Cobra catheter was then exchanged for an Omni Flush catheter. Lower aortic angiogram and pelvic angiogram performed. Variation of catheters used to select inferior mesenteric artery. CT cobra catheter, Chung 2.5 catheter, and Roche inferior mesenteric catheter were used. The rim catheter was successful in selecting the origin of the inferior mesenteric artery. IMA angiogram performed. Micro catheter system was then advanced through the rim catheter. 105 cm micro catheter and a combination of a 14 Fathom and a synchro soft were used for selective/sub selective angiogram of branches of the superior rectal artery. Once the catheter tip was confirmed to be within abnormal fourth order vessel of the superior rectal branches, coil embolization was performed with 2 mm x 2 cm and a 2 mm x 4 cm coil. Final angiogram performed. Angiogram of the right common femoral artery was performed. Exoseal was deployed for hemostasis. FINDINGS: Superior mesenteric artery angiogram demonstrates patent vasculature with no tumor blush, extravasation of contrast, or contrast stasis. Contribution through the middle colic artery to the gastroepiploic artery. Lower aortic angiogram demonstrates mild atherosclerotic changes with no aneurysm or dissection. Moderate atherosclerotic changes of the right common iliac artery with mild atherosclerotic changes of the left  common iliac artery. No asymmetric flow. Bilateral L5 lumbar arteries are patent. Bilateral hypogastric arteries patent. No significant atherosclerotic changes of the proximal femoral vasculature. IMA angiogram demonstrates venous shunting/contrast staining associated with fourth order branches of the superior rectal  artery. Widely patent left colic artery with patent marginal artery of [place]. Status post coil embolization of abnormal fourth order branch with no ongoing contrast staining or venous shunting. IMPRESSION: Status post mesenteric artery angiogram and coil embolization of abnormal fourth order branch of superior rectal artery for treatment of acute lower GI hemorrhage. Deployment of Exoseal for right common femoral artery hemostasis. Signed, Dulcy Fanny. Earleen Newport, DO [facility] Radiology Electronically Signed   By: Corrie Mckusick D.O.   On: 06/19/2016 18:19   Ir Angiogram Selective Each Additional Vessel  Result Date: 06/19/2016 INDICATION: 82 year old female with new painless lower gastrointestinal hemorrhage, positive nuclear medicine bleeding scan, and hemodynamic instability. She has been referred for evaluation of angiogram and possible therapy. EXAM: SELECTIVE VISCERAL ARTERIOGRAPHY; IR ULTRASOUND GUIDANCE VASC ACCESS RIGHT; IR EMBO ART VEN HEMORR LYMPH EXTRAV INC GUIDE ROADMAPPING; ARTERIOGRAPHY; ADDITIONAL ARTERIOGRAPHY MEDICATIONS: None ANESTHESIA/SEDATION: Moderate (conscious) sedation was employed during this procedure. A total of Versed 1.5 mg and Fentanyl 62.5 mcg was administered intravenously. Moderate Sedation Time: 90 minutes. The patient's level of consciousness and vital signs were monitored continuously by radiology nursing throughout the procedure under my direct supervision. CONTRAST:  270 cc FLUOROSCOPY TIME:  Fluoroscopy Time: 27 minutes 36 seconds (74.9 mGy). COMPLICATIONS: None immediate. PROCEDURE: Informed consent was obtained  from the patient following explanation of the procedure, risks, benefits and alternatives. The patient understands, agrees and consents for the procedure. All questions were addressed. A time out was performed prior to the initiation of the procedure. Maximal barrier sterile technique utilized including caps, mask, sterile gowns, sterile gloves, large sterile drape, hand hygiene, and Betadine prep. Ultrasound survey of the right inguinal region was performed with images stored and sent to PACs. A micropuncture needle was used access the right common femoral artery under ultrasound. With excellent arterial blood flow returned, and an .018 micro wire was passed through the needle, observed enter the abdominal aorta under fluoroscopy. The needle was removed, and a micropuncture sheath was placed over the wire. The inner dilator and wire were removed, and an 035 Bentson wire was advanced under fluoroscopy into the abdominal aorta. The sheath was removed and a standard 5 [place] vascular sheath was placed. The dilator was removed and the sheath was flushed. Standard C2 Cobra catheter was advanced over the wire into the abdomen. Cobra catheter was used to select the superior mesenteric artery. Mesenteric artery angiogram was performed. Cobra catheter was then exchanged for an Omni Flush catheter. Lower aortic angiogram and pelvic angiogram performed. Variation of catheters used to select inferior mesenteric artery. CT cobra catheter, Chung 2.5 catheter, and Roche inferior mesenteric catheter were used. The rim catheter was successful in selecting the origin of the inferior mesenteric artery. IMA angiogram performed. Micro catheter system was then advanced through the rim catheter. 105 cm micro catheter and a combination of a 14 Fathom and a synchro soft were used for selective/sub selective angiogram of branches of the superior rectal artery. Once the catheter tip was confirmed to be within abnormal fourth order vessel of  the superior rectal branches, coil embolization was performed with 2 mm x 2 cm and a 2 mm x 4 cm coil. Final angiogram performed. Angiogram of the right common femoral artery was performed. Exoseal was deployed for hemostasis. FINDINGS: Superior mesenteric artery angiogram demonstrates patent vasculature with no tumor blush, extravasation of contrast, or contrast stasis. Contribution through the middle colic artery to the gastroepiploic artery. Lower aortic angiogram demonstrates mild atherosclerotic changes  with no aneurysm or dissection. Moderate atherosclerotic changes of the right common iliac artery with mild atherosclerotic changes of the left common iliac artery. No asymmetric flow. Bilateral L5 lumbar arteries are patent. Bilateral hypogastric arteries patent. No significant atherosclerotic changes of the proximal femoral vasculature. IMA angiogram demonstrates venous shunting/contrast staining associated with fourth order branches of the superior rectal artery. Widely patent left colic artery with patent marginal artery of [place]. Status post coil embolization of abnormal fourth order branch with no ongoing contrast staining or venous shunting. IMPRESSION: Status post mesenteric artery angiogram and coil embolization of abnormal fourth order branch of superior rectal artery for treatment of acute lower GI hemorrhage. Deployment of Exoseal for right common femoral artery hemostasis. Signed, Dulcy Fanny. Earleen Newport, DO [facility] Radiology Electronically Signed   By: Corrie Mckusick D.O.   On: 06/19/2016 18:19   Ir Angiogram Follow Up Study  Result Date: 06/19/2016 INDICATION: 82 year old female with new painless lower gastrointestinal hemorrhage, positive nuclear medicine bleeding scan, and hemodynamic instability. She has been referred for evaluation of angiogram and possible therapy. EXAM: SELECTIVE VISCERAL ARTERIOGRAPHY; IR ULTRASOUND GUIDANCE VASC ACCESS  RIGHT; IR EMBO ART VEN HEMORR LYMPH EXTRAV INC GUIDE ROADMAPPING; ARTERIOGRAPHY; ADDITIONAL ARTERIOGRAPHY MEDICATIONS: None ANESTHESIA/SEDATION: Moderate (conscious) sedation was employed during this procedure. A total of Versed 1.5 mg and Fentanyl 62.5 mcg was administered intravenously. Moderate Sedation Time: 90 minutes. The patient's level of consciousness and vital signs were monitored continuously by radiology nursing throughout the procedure under my direct supervision. CONTRAST:  270 cc FLUOROSCOPY TIME:  Fluoroscopy Time: 27 minutes 36 seconds (74.9 mGy). COMPLICATIONS: None immediate. PROCEDURE: Informed consent was obtained from the patient following explanation of the procedure, risks, benefits and alternatives. The patient understands, agrees and consents for the procedure. All questions were addressed. A time out was performed prior to the initiation of the procedure. Maximal barrier sterile technique utilized including caps, mask, sterile gowns, sterile gloves, large sterile drape, hand hygiene, and Betadine prep. Ultrasound survey of the right inguinal region was performed with images stored and sent to PACs. A micropuncture needle was used access the right common femoral artery under ultrasound. With excellent arterial blood flow returned, and an .018 micro wire was passed through the needle, observed enter the abdominal aorta under fluoroscopy. The needle was removed, and a micropuncture sheath was placed over the wire. The inner dilator and wire were removed, and an 035 Bentson wire was advanced under fluoroscopy into the abdominal aorta. The sheath was removed and a standard 5 [place] vascular sheath was placed. The dilator was removed and the sheath was flushed. Standard C2 Cobra catheter was advanced over the wire into the abdomen. Cobra catheter was used to select the superior mesenteric artery. Mesenteric artery angiogram was performed. Cobra catheter was then exchanged for an Omni Flush  catheter. Lower aortic angiogram and pelvic angiogram performed. Variation of catheters used to select inferior mesenteric artery. CT cobra catheter, Chung 2.5 catheter, and Roche inferior mesenteric catheter were used. The rim catheter was successful in selecting the origin of the inferior mesenteric artery. IMA angiogram performed. Micro catheter system was then advanced through the rim catheter. 105 cm micro catheter and a combination of a 14 Fathom and a synchro soft were used for selective/sub selective angiogram of branches of the superior rectal artery. Once the catheter tip was confirmed to be within abnormal fourth order vessel of the superior rectal branches, coil embolization was performed with 2 mm x 2 cm  and a 2 mm x 4 cm coil. Final angiogram performed. Angiogram of the right common femoral artery was performed. Exoseal was deployed for hemostasis. FINDINGS: Superior mesenteric artery angiogram demonstrates patent vasculature with no tumor blush, extravasation of contrast, or contrast stasis. Contribution through the middle colic artery to the gastroepiploic artery. Lower aortic angiogram demonstrates mild atherosclerotic changes with no aneurysm or dissection. Moderate atherosclerotic changes of the right common iliac artery with mild atherosclerotic changes of the left common iliac artery. No asymmetric flow. Bilateral L5 lumbar arteries are patent. Bilateral hypogastric arteries patent. No significant atherosclerotic changes of the proximal femoral vasculature. IMA angiogram demonstrates venous shunting/contrast staining associated with fourth order branches of the superior rectal artery. Widely patent left colic artery with patent marginal artery of [place]. Status post coil embolization of abnormal fourth order branch with no ongoing contrast staining or venous shunting. IMPRESSION: Status post mesenteric artery angiogram and coil embolization of abnormal fourth order branch of superior rectal  artery for treatment of acute lower GI hemorrhage. Deployment of Exoseal for right common femoral artery hemostasis. Signed, Dulcy Fanny. Earleen Newport, DO [facility] Radiology Electronically Signed   By: Corrie Mckusick D.O.   On: 06/19/2016 18:19   Ir US Guide Vasc Access Right  Result Date: 06/19/2016 INDICATION: 82 year old female with new painless lower gastrointestinal hemorrhage, positive nuclear medicine bleeding scan, and hemodynamic instability. She has been referred for evaluation of angiogram and possible therapy. EXAM: SELECTIVE VISCERAL ARTERIOGRAPHY; IR ULTRASOUND GUIDANCE VASC ACCESS RIGHT; IR EMBO ART VEN HEMORR LYMPH EXTRAV INC GUIDE ROADMAPPING; ARTERIOGRAPHY; ADDITIONAL ARTERIOGRAPHY MEDICATIONS: None ANESTHESIA/SEDATION: Moderate (conscious) sedation was employed during this procedure. A total of Versed 1.5 mg and Fentanyl 62.5 mcg was administered intravenously. Moderate Sedation Time: 90 minutes. The patient's level of consciousness and vital signs were monitored continuously by radiology nursing throughout the procedure under my direct supervision. CONTRAST:  270 cc FLUOROSCOPY TIME:  Fluoroscopy Time: 27 minutes 36 seconds (74.9 mGy). COMPLICATIONS: None immediate. PROCEDURE: Informed consent was obtained from the patient following explanation of the procedure, risks, benefits and alternatives. The patient understands, agrees and consents for the procedure. All questions were addressed. A time out was performed prior to the initiation of the procedure. Maximal barrier sterile technique utilized including caps, mask, sterile gowns, sterile gloves, large sterile drape, hand hygiene, and Betadine prep. Ultrasound survey of the right inguinal region was performed with images stored and sent to PACs. A micropuncture needle was used access the right common femoral artery under ultrasound. With excellent arterial blood flow returned, and an .018 micro wire  was passed through the needle, observed enter the abdominal aorta under fluoroscopy. The needle was removed, and a micropuncture sheath was placed over the wire. The inner dilator and wire were removed, and an 035 Bentson wire was advanced under fluoroscopy into the abdominal aorta. The sheath was removed and a standard 5 [place] vascular sheath was placed. The dilator was removed and the sheath was flushed. Standard C2 Cobra catheter was advanced over the wire into the abdomen. Cobra catheter was used to select the superior mesenteric artery. Mesenteric artery angiogram was performed. Cobra catheter was then exchanged for an Omni Flush catheter. Lower aortic angiogram and pelvic angiogram performed. Variation of catheters used to select inferior mesenteric artery. CT cobra catheter, Chung 2.5 catheter, and Roche inferior mesenteric catheter were used. The rim catheter was successful in selecting the origin of the inferior mesenteric artery. IMA angiogram performed. Micro catheter system was then  advanced through the rim catheter. 105 cm micro catheter and a combination of a 14 Fathom and a synchro soft were used for selective/sub selective angiogram of branches of the superior rectal artery. Once the catheter tip was confirmed to be within abnormal fourth order vessel of the superior rectal branches, coil embolization was performed with 2 mm x 2 cm and a 2 mm x 4 cm coil. Final angiogram performed. Angiogram of the right common femoral artery was performed. Exoseal was deployed for hemostasis. FINDINGS: Superior mesenteric artery angiogram demonstrates patent vasculature with no tumor blush, extravasation of contrast, or contrast stasis. Contribution through the middle colic artery to the gastroepiploic artery. Lower aortic angiogram demonstrates mild atherosclerotic changes with no aneurysm or dissection. Moderate atherosclerotic changes of the right common iliac artery with mild atherosclerotic changes of the left  common iliac artery. No asymmetric flow. Bilateral L5 lumbar arteries are patent. Bilateral hypogastric arteries patent. No significant atherosclerotic changes of the proximal femoral vasculature. IMA angiogram demonstrates venous shunting/contrast staining associated with fourth order branches of the superior rectal artery. Widely patent left colic artery with patent marginal artery of [place]. Status post coil embolization of abnormal fourth order branch with no ongoing contrast staining or venous shunting. IMPRESSION: Status post mesenteric artery angiogram and coil embolization of abnormal fourth order branch of superior rectal artery for treatment of acute lower GI hemorrhage. Deployment of Exoseal for right common femoral artery hemostasis. Signed, Dulcy Fanny. Earleen Newport, DO [facility] Radiology Electronically Signed   By: Corrie Mckusick D.O.   On: 06/19/2016 18:19   Ir Embo Art  Dyer Guide Roadmapping  Result Date: 06/19/2016 INDICATION: 82 year old female with new painless lower gastrointestinal hemorrhage, positive nuclear medicine bleeding scan, and hemodynamic instability. She has been referred for evaluation of angiogram and possible therapy. EXAM: SELECTIVE VISCERAL ARTERIOGRAPHY; IR ULTRASOUND GUIDANCE VASC ACCESS RIGHT; IR EMBO ART VEN HEMORR LYMPH EXTRAV INC GUIDE ROADMAPPING; ARTERIOGRAPHY; ADDITIONAL ARTERIOGRAPHY MEDICATIONS: None ANESTHESIA/SEDATION: Moderate (conscious) sedation was employed during this procedure. A total of Versed 1.5 mg and Fentanyl 62.5 mcg was administered intravenously. Moderate Sedation Time: 90 minutes. The patient's level of consciousness and vital signs were monitored continuously by radiology nursing throughout the procedure under my direct supervision. CONTRAST:  270 cc FLUOROSCOPY TIME:  Fluoroscopy Time: 27 minutes 36 seconds (74.9 mGy). COMPLICATIONS: None immediate. PROCEDURE: Informed consent  was obtained from the patient following explanation of the procedure, risks, benefits and alternatives. The patient understands, agrees and consents for the procedure. All questions were addressed. A time out was performed prior to the initiation of the procedure. Maximal barrier sterile technique utilized including caps, mask, sterile gowns, sterile gloves, large sterile drape, hand hygiene, and Betadine prep. Ultrasound survey of the right inguinal region was performed with images stored and sent to PACs. A micropuncture needle was used access the right common femoral artery under ultrasound. With excellent arterial blood flow returned, and an .018 micro wire was passed through the needle, observed enter the abdominal aorta under fluoroscopy. The needle was removed, and a micropuncture sheath was placed over the wire. The inner dilator and wire were removed, and an 035 Bentson wire was advanced under fluoroscopy into the abdominal aorta. The sheath was removed and a standard 5 [place] vascular sheath was placed. The dilator was removed and the sheath was flushed. Standard C2 Cobra catheter was advanced over the wire into the abdomen. Cobra catheter was used to select the superior mesenteric  artery. Mesenteric artery angiogram was performed. Cobra catheter was then exchanged for an Omni Flush catheter. Lower aortic angiogram and pelvic angiogram performed. Variation of catheters used to select inferior mesenteric artery. CT cobra catheter, Chung 2.5 catheter, and Roche inferior mesenteric catheter were used. The rim catheter was successful in selecting the origin of the inferior mesenteric artery. IMA angiogram performed. Micro catheter system was then advanced through the rim catheter. 105 cm micro catheter and a combination of a 14 Fathom and a synchro soft were used for selective/sub selective angiogram of branches of the superior rectal artery. Once the catheter tip was confirmed to be within abnormal fourth  order vessel of the superior rectal branches, coil embolization was performed with 2 mm x 2 cm and a 2 mm x 4 cm coil. Final angiogram performed. Angiogram of the right common femoral artery was performed. Exoseal was deployed for hemostasis. FINDINGS: Superior mesenteric artery angiogram demonstrates patent vasculature with no tumor blush, extravasation of contrast, or contrast stasis. Contribution through the middle colic artery to the gastroepiploic artery. Lower aortic angiogram demonstrates mild atherosclerotic changes with no aneurysm or dissection. Moderate atherosclerotic changes of the right common iliac artery with mild atherosclerotic changes of the left common iliac artery. No asymmetric flow. Bilateral L5 lumbar arteries are patent. Bilateral hypogastric arteries patent. No significant atherosclerotic changes of the proximal femoral vasculature. IMA angiogram demonstrates venous shunting/contrast staining associated with fourth order branches of the superior rectal artery. Widely patent left colic artery with patent marginal artery of [place]. Status post coil embolization of abnormal fourth order branch with no ongoing contrast staining or venous shunting. IMPRESSION: Status post mesenteric artery angiogram and coil embolization of abnormal fourth order branch of superior rectal artery for treatment of acute lower GI hemorrhage. Deployment of Exoseal for right common femoral artery hemostasis. Signed, Dulcy Fanny. Earleen Newport, DO [facility] Radiology Electronically Signed   By: Corrie Mckusick D.O.   On: 06/19/2016 18:19    Labs:  CBC:  Recent Labs  06/18/16 0934 06/18/16 0943 06/18/16 1818 06/19/16 0339 06/20/16 0327  WBC 5.7  --  5.4 5.6 11.2*  HGB 8.7* 8.2* 9.3* 10.4* 6.0*  HCT 25.3* 24.0* 27.1* 29.5* 17.2*  PLT 225  --  201 162 139*    COAGS:  Recent Labs  06/18/16 0627  INR 0.99  APTT 26    BMP:  Recent Labs  09/08/15 1255  06/18/16 0558 06/18/16 0943 06/19/16 0339 06/20/16 0327  NA 135 132* 131* 131* 135  K 4.5 4.6 4.1 3.9 4.0  CL 97* 98* 99* 104 109  CO2 27 25  --  22 17*  GLUCOSE 119* 135* 111* 106* 110*  BUN 12 23* 21* 18 21*  CALCIUM 9.8 9.6  --  8.2* 7.8*  CREATININE 0.91 0.93 0.90 0.82 0.81  GFRNONAA 55* 53*  --  >60 >60  GFRAA >60 >60  --  >60 >60    LIVER FUNCTION TESTS:  Recent Labs  06/18/16 0558  BILITOT 0.3  AST 29  ALT 16  ALKPHOS 32*  PROT 6.6  ALBUMIN 4.0    Assessment and Plan:  S/P Mesenteric angiogram of SMA & IMA, selective angiogram of superior rectal branches, coil embo of abnormal branch from the superior rectal by Dr. Earleen Newport  Hemoglobin 6 this am = currently receiving blood.  Continue to follow closely with serial H&Hs.  Electronically Signed: Murrell Redden PA-C 06/20/2016, 11:17 AM   I spent a total of  15 Minutes at the the patient's bedside AND on the patient's hospital floor or unit, greater than 50% of which was counseling/coordinating care for f/u arterial embo for GI bleed.

## 2016-06-20 NOTE — Progress Notes (Signed)
Pt's BP was 79/41, denies chest pain, denies nausea and vomiting, not in respiratory distress. K. Schorr was notified and ordered to give 288ml Normal Saline bolus. Will monitor accordingly.   06/19/16 1939  Vitals  BP (!) 79/41  BP Location Left Arm  BP Method Automatic  Patient Position (if appropriate) Lying  Pulse Rate 75  Pulse Rate Source Dinamap  Resp 16  Oxygen Therapy  SpO2 98 %  O2 Device Room [employer]

## 2016-06-21 DIAGNOSIS — D62 Acute posthemorrhagic anemia: Secondary | ICD-10-CM

## 2016-06-21 DIAGNOSIS — I480 Paroxysmal atrial fibrillation: Secondary | ICD-10-CM

## 2016-06-21 DIAGNOSIS — K922 Gastrointestinal hemorrhage, unspecified: Secondary | ICD-10-CM

## 2016-06-21 DIAGNOSIS — I5022 Chronic systolic (congestive) heart failure: Secondary | ICD-10-CM

## 2016-06-21 LAB — BASIC METABOLIC PANEL
ANION GAP: 6 (ref 5–15)
BUN: 14 mg/dL (ref 6–20)
CO2: 22 mmol/L (ref 22–32)
Calcium: 8.1 mg/dL — ABNORMAL LOW (ref 8.9–10.3)
Chloride: 108 mmol/L (ref 101–111)
Creatinine, Ser: 0.78 mg/dL (ref 0.44–1.00)
GLUCOSE: 93 mg/dL (ref 65–99)
POTASSIUM: 3.8 mmol/L (ref 3.5–5.1)
Sodium: 136 mmol/L (ref 135–145)

## 2016-06-21 LAB — TYPE AND SCREEN
ABO/RH(D): A POS
Antibody Screen: NEGATIVE
UNIT DIVISION: 0
UNIT DIVISION: 0
Unit division: 0
Unit division: 0

## 2016-06-21 LAB — CBC
HEMATOCRIT: 27.7 % — AB (ref 36.0–46.0)
Hemoglobin: 10 g/dL — ABNORMAL LOW (ref 12.0–15.0)
MCH: 29.9 pg (ref 26.0–34.0)
MCHC: 36.1 g/dL — ABNORMAL HIGH (ref 30.0–36.0)
MCV: 82.9 fL (ref 78.0–100.0)
Platelets: 121 10*3/uL — ABNORMAL LOW (ref 150–400)
RBC: 3.34 MIL/uL — AB (ref 3.87–5.11)
RDW: 16.5 % — ABNORMAL HIGH (ref 11.5–15.5)
WBC: 7 10*3/uL (ref 4.0–10.5)

## 2016-06-21 NOTE — Progress Notes (Signed)
Physical therapy order to evaluate patient, patient refuse any home health service. MD notified. Will d/c patient per order.

## 2016-06-21 NOTE — Progress Notes (Signed)
Referring Physician(s): Ganem,S  Supervising Physician: Aletta Edouard  Patient Status:  [facility] - In-pt  Chief Complaint:  GI bleed  Subjective:  Pt anxious to go home; has only had small amt blood on pad since yesterday; no BM today;denies abd pain,N/V  Allergies: Codeine  Medications: Prior to Admission medications   Medication Sig Start Date End Date Taking? Authorizing Provider  acetaminophen (TYLENOL 8 HOUR) 650 MG CR tablet Take 1 tablet (650 mg total) by mouth every 8 (eight) hours as needed for pain. 09/08/15  Yes Varney Biles, MD  aspirin 81 MG tablet Take 81 mg by mouth daily.     Yes Historical Provider, MD  bimatoprost (LUMIGAN) 0.03 % ophthalmic drops Place 1 drop into both eyes at bedtime.    Yes Historical Provider, MD  carvedilol (COREG) 25 MG tablet Take 12.5 mg by mouth 2 (two) times daily with a meal.     Yes Historical Provider, MD  Cholecalciferol (VITAMIN D PO) Take 1 tablet by mouth 2 (two) times daily.    Yes Historical Provider, MD  Cinnamon 500 MG TABS Take 1 tablet by mouth 1 day or 1 dose.   Yes Historical Provider, MD  gabapentin (NEURONTIN) 100 MG capsule Take 100 mg by mouth 2 (two) times daily.   Yes Historical Provider, MD  meclizine (ANTIVERT) 25 MG tablet Take 25 mg by mouth 2 (two) times daily.    Yes Historical Provider, MD  timolol (BETIMOL) 0.5 % ophthalmic solution Place 1 drop into both eyes 2 (two) times daily.    Yes Historical Provider, MD     Vital Signs: BP 137/67 (BP Location: Left Arm)   Pulse 77   Temp 97.8 F (36.6 C) (Oral)   Resp 18   Ht 4\' 2"  (1.27 m)   Wt 92 lb 3.2 oz (41.8 kg)   SpO2 99%   BMI 25.93 kg/m   Physical Exam alert; abd soft, NT, rt CFA puncture site soft,NT, no hematoma; no visible blood on pad this am  Imaging: Nm Gi Blood Loss  Result Date: 06/19/2016 CLINICAL DATA:  GI bleed. EXAM: NUCLEAR MEDICINE GASTROINTESTINAL BLEEDING SCAN TECHNIQUE: Sequential abdominal images were obtained following  intravenous administration of Tc-75m labeled red blood cells. RADIOPHARMACEUTICALS:  25.7 mCi Tc-69m in-vitro labeled red cells. COMPARISON:  None. FINDINGS: The study is positive for a GI bleed beginning in the left lower abdomen/ upper pelvis. The patient was very agitated with significant motion precluding further imaging. However, the bleed appears to originate in the descending colon. IMPRESSION: GI bleed beginning in the left lower abdomen/ upper pelvis, thought to originate in the descending colon. These results will be called to the ordering clinician or representative by the Radiologist Assistant, and communication documented in the PACS or zVision Dashboard. Electronically Signed   By: Dorise Bullion III M.D   On: 06/19/2016 13:00   Ir Angiogram Visceral Selective  Result Date: 06/19/2016 INDICATION: 82 year old female with new painless lower gastrointestinal hemorrhage, positive nuclear medicine bleeding scan, and hemodynamic instability. She has been referred for evaluation of angiogram and possible therapy. EXAM: SELECTIVE VISCERAL ARTERIOGRAPHY; IR ULTRASOUND GUIDANCE VASC ACCESS RIGHT; IR EMBO ART VEN HEMORR LYMPH EXTRAV INC GUIDE ROADMAPPING; ARTERIOGRAPHY; ADDITIONAL ARTERIOGRAPHY MEDICATIONS: None ANESTHESIA/SEDATION: Moderate (conscious) sedation was employed during this procedure. A total of Versed 1.5 mg and Fentanyl 62.5 mcg was administered intravenously. Moderate Sedation Time: 90 minutes. The patient's level of consciousness and vital signs were monitored continuously by radiology nursing throughout the procedure under  my direct supervision. CONTRAST:  270 cc FLUOROSCOPY TIME:  Fluoroscopy Time: 27 minutes 36 seconds (74.9 mGy). COMPLICATIONS: None immediate. PROCEDURE: Informed consent was obtained from the patient following explanation of the procedure, risks, benefits and alternatives. The patient understands, agrees and consents for the procedure. All questions were addressed. A  time out was performed prior to the initiation of the procedure. Maximal barrier sterile technique utilized including caps, mask, sterile gowns, sterile gloves, large sterile drape, hand hygiene, and Betadine prep. Ultrasound survey of the right inguinal region was performed with images stored and sent to PACs. A micropuncture needle was used access the right common femoral artery under ultrasound. With excellent arterial blood flow returned, and an .018 micro wire was passed through the needle, observed enter the abdominal aorta under fluoroscopy. The needle was removed, and a micropuncture sheath was placed over the wire. The inner dilator and wire were removed, and an 035 Bentson wire was advanced under fluoroscopy into the abdominal aorta. The sheath was removed and a standard 5 [place] vascular sheath was placed. The dilator was removed and the sheath was flushed. Standard C2 Cobra catheter was advanced over the wire into the abdomen. Cobra catheter was used to select the superior mesenteric artery. Mesenteric artery angiogram was performed. Cobra catheter was then exchanged for an Omni Flush catheter. Lower aortic angiogram and pelvic angiogram performed. Variation of catheters used to select inferior mesenteric artery. CT cobra catheter, Chung 2.5 catheter, and Roche inferior mesenteric catheter were used. The rim catheter was successful in selecting the origin of the inferior mesenteric artery. IMA angiogram performed. Micro catheter system was then advanced through the rim catheter. 105 cm micro catheter and a combination of a 14 Fathom and a synchro soft were used for selective/sub selective angiogram of branches of the superior rectal artery. Once the catheter tip was confirmed to be within abnormal fourth order vessel of the superior rectal branches, coil embolization was performed with 2 mm x 2 cm and a 2 mm x 4 cm coil. Final angiogram performed. Angiogram of the right common femoral artery was  performed. Exoseal was deployed for hemostasis. FINDINGS: Superior mesenteric artery angiogram demonstrates patent vasculature with no tumor blush, extravasation of contrast, or contrast stasis. Contribution through the middle colic artery to the gastroepiploic artery. Lower aortic angiogram demonstrates mild atherosclerotic changes with no aneurysm or dissection. Moderate atherosclerotic changes of the right common iliac artery with mild atherosclerotic changes of the left common iliac artery. No asymmetric flow. Bilateral L5 lumbar arteries are patent. Bilateral hypogastric arteries patent. No significant atherosclerotic changes of the proximal femoral vasculature. IMA angiogram demonstrates venous shunting/contrast staining associated with fourth order branches of the superior rectal artery. Widely patent left colic artery with patent marginal artery of [place]. Status post coil embolization of abnormal fourth order branch with no ongoing contrast staining or venous shunting. IMPRESSION: Status post mesenteric artery angiogram and coil embolization of abnormal fourth order branch of superior rectal artery for treatment of acute lower GI hemorrhage. Deployment of Exoseal for right common femoral artery hemostasis. Signed, Dulcy Fanny. Earleen Newport, DO [facility] Radiology Electronically Signed   By: Corrie Mckusick D.O.   On: 06/19/2016 18:19   Ir Angiogram Visceral Selective  Result Date: 06/19/2016 INDICATION: 82 year old female with new painless lower gastrointestinal hemorrhage, positive nuclear medicine bleeding scan, and hemodynamic instability. She has been referred for evaluation of angiogram and possible therapy. EXAM: SELECTIVE VISCERAL ARTERIOGRAPHY; IR ULTRASOUND GUIDANCE VASC ACCESS RIGHT; IR  EMBO ART VEN HEMORR LYMPH EXTRAV INC GUIDE ROADMAPPING; ARTERIOGRAPHY; ADDITIONAL ARTERIOGRAPHY MEDICATIONS: None ANESTHESIA/SEDATION: Moderate (conscious) sedation was  employed during this procedure. A total of Versed 1.5 mg and Fentanyl 62.5 mcg was administered intravenously. Moderate Sedation Time: 90 minutes. The patient's level of consciousness and vital signs were monitored continuously by radiology nursing throughout the procedure under my direct supervision. CONTRAST:  270 cc FLUOROSCOPY TIME:  Fluoroscopy Time: 27 minutes 36 seconds (74.9 mGy). COMPLICATIONS: None immediate. PROCEDURE: Informed consent was obtained from the patient following explanation of the procedure, risks, benefits and alternatives. The patient understands, agrees and consents for the procedure. All questions were addressed. A time out was performed prior to the initiation of the procedure. Maximal barrier sterile technique utilized including caps, mask, sterile gowns, sterile gloves, large sterile drape, hand hygiene, and Betadine prep. Ultrasound survey of the right inguinal region was performed with images stored and sent to PACs. A micropuncture needle was used access the right common femoral artery under ultrasound. With excellent arterial blood flow returned, and an .018 micro wire was passed through the needle, observed enter the abdominal aorta under fluoroscopy. The needle was removed, and a micropuncture sheath was placed over the wire. The inner dilator and wire were removed, and an 035 Bentson wire was advanced under fluoroscopy into the abdominal aorta. The sheath was removed and a standard 5 [place] vascular sheath was placed. The dilator was removed and the sheath was flushed. Standard C2 Cobra catheter was advanced over the wire into the abdomen. Cobra catheter was used to select the superior mesenteric artery. Mesenteric artery angiogram was performed. Cobra catheter was then exchanged for an Omni Flush catheter. Lower aortic angiogram and pelvic angiogram performed. Variation of catheters used to select inferior mesenteric artery. CT cobra catheter, Chung 2.5 catheter, and Roche  inferior mesenteric catheter were used. The rim catheter was successful in selecting the origin of the inferior mesenteric artery. IMA angiogram performed. Micro catheter system was then advanced through the rim catheter. 105 cm micro catheter and a combination of a 14 Fathom and a synchro soft were used for selective/sub selective angiogram of branches of the superior rectal artery. Once the catheter tip was confirmed to be within abnormal fourth order vessel of the superior rectal branches, coil embolization was performed with 2 mm x 2 cm and a 2 mm x 4 cm coil. Final angiogram performed. Angiogram of the right common femoral artery was performed. Exoseal was deployed for hemostasis. FINDINGS: Superior mesenteric artery angiogram demonstrates patent vasculature with no tumor blush, extravasation of contrast, or contrast stasis. Contribution through the middle colic artery to the gastroepiploic artery. Lower aortic angiogram demonstrates mild atherosclerotic changes with no aneurysm or dissection. Moderate atherosclerotic changes of the right common iliac artery with mild atherosclerotic changes of the left common iliac artery. No asymmetric flow. Bilateral L5 lumbar arteries are patent. Bilateral hypogastric arteries patent. No significant atherosclerotic changes of the proximal femoral vasculature. IMA angiogram demonstrates venous shunting/contrast staining associated with fourth order branches of the superior rectal artery. Widely patent left colic artery with patent marginal artery of [place]. Status post coil embolization of abnormal fourth order branch with no ongoing contrast staining or venous shunting. IMPRESSION: Status post mesenteric artery angiogram and coil embolization of abnormal fourth order branch of superior rectal artery for treatment of acute lower GI hemorrhage. Deployment of Exoseal for right common femoral artery hemostasis. Signed, Dulcy Fanny. Earleen Newport, DO [facility] Rad[facility]  [facility] Radiology Electronically Signed  By: Corrie Mckusick D.O.   On: 06/19/2016 18:19   Ir Angiogram Selective Each Additional Vessel  Result Date: 06/19/2016 INDICATION: 82 year old female with new painless lower gastrointestinal hemorrhage, positive nuclear medicine bleeding scan, and hemodynamic instability. She has been referred for evaluation of angiogram and possible therapy. EXAM: SELECTIVE VISCERAL ARTERIOGRAPHY; IR ULTRASOUND GUIDANCE VASC ACCESS RIGHT; IR EMBO ART VEN HEMORR LYMPH EXTRAV INC GUIDE ROADMAPPING; ARTERIOGRAPHY; ADDITIONAL ARTERIOGRAPHY MEDICATIONS: None ANESTHESIA/SEDATION: Moderate (conscious) sedation was employed during this procedure. A total of Versed 1.5 mg and Fentanyl 62.5 mcg was administered intravenously. Moderate Sedation Time: 90 minutes. The patient's level of consciousness and vital signs were monitored continuously by radiology nursing throughout the procedure under my direct supervision. CONTRAST:  270 cc FLUOROSCOPY TIME:  Fluoroscopy Time: 27 minutes 36 seconds (74.9 mGy). COMPLICATIONS: None immediate. PROCEDURE: Informed consent was obtained from the patient following explanation of the procedure, risks, benefits and alternatives. The patient understands, agrees and consents for the procedure. All questions were addressed. A time out was performed prior to the initiation of the procedure. Maximal barrier sterile technique utilized including caps, mask, sterile gowns, sterile gloves, large sterile drape, hand hygiene, and Betadine prep. Ultrasound survey of the right inguinal region was performed with images stored and sent to PACs. A micropuncture needle was used access the right common femoral artery under ultrasound. With excellent arterial blood flow returned, and an .018 micro wire was passed through the needle, observed enter the abdominal aorta under fluoroscopy. The needle was removed, and a micropuncture sheath was placed over the wire. The  inner dilator and wire were removed, and an 035 Bentson wire was advanced under fluoroscopy into the abdominal aorta. The sheath was removed and a standard 5 [place] vascular sheath was placed. The dilator was removed and the sheath was flushed. Standard C2 Cobra catheter was advanced over the wire into the abdomen. Cobra catheter was used to select the superior mesenteric artery. Mesenteric artery angiogram was performed. Cobra catheter was then exchanged for an Omni Flush catheter. Lower aortic angiogram and pelvic angiogram performed. Variation of catheters used to select inferior mesenteric artery. CT cobra catheter, Chung 2.5 catheter, and Roche inferior mesenteric catheter were used. The rim catheter was successful in selecting the origin of the inferior mesenteric artery. IMA angiogram performed. Micro catheter system was then advanced through the rim catheter. 105 cm micro catheter and a combination of a 14 Fathom and a synchro soft were used for selective/sub selective angiogram of branches of the superior rectal artery. Once the catheter tip was confirmed to be within abnormal fourth order vessel of the superior rectal branches, coil embolization was performed with 2 mm x 2 cm and a 2 mm x 4 cm coil. Final angiogram performed. Angiogram of the right common femoral artery was performed. Exoseal was deployed for hemostasis. FINDINGS: Superior mesenteric artery angiogram demonstrates patent vasculature with no tumor blush, extravasation of contrast, or contrast stasis. Contribution through the middle colic artery to the gastroepiploic artery. Lower aortic angiogram demonstrates mild atherosclerotic changes with no aneurysm or dissection. Moderate atherosclerotic changes of the right common iliac artery with mild atherosclerotic changes of the left common iliac artery. No asymmetric flow. Bilateral L5 lumbar arteries are patent. Bilateral hypogastric arteries patent. No significant atherosclerotic changes of  the proximal femoral vasculature. IMA angiogram demonstrates venous shunting/contrast staining associated with fourth order branches of the superior rectal artery. Widely patent left colic artery with patent marginal artery of [place]. Status post coil embolization of abnormal  fourth order branch with no ongoing contrast staining or venous shunting. IMPRESSION: Status post mesenteric artery angiogram and coil embolization of abnormal fourth order branch of superior rectal artery for treatment of acute lower GI hemorrhage. Deployment of Exoseal for right common femoral artery hemostasis. Signed, Dulcy Fanny. Earleen Newport, DO [facility] Radiology Electronically Signed   By: Corrie Mckusick D.O.   On: 06/19/2016 18:19   Ir Angiogram Selective Each Additional Vessel  Result Date: 06/19/2016 INDICATION: 82 year old female with new painless lower gastrointestinal hemorrhage, positive nuclear medicine bleeding scan, and hemodynamic instability. She has been referred for evaluation of angiogram and possible therapy. EXAM: SELECTIVE VISCERAL ARTERIOGRAPHY; IR ULTRASOUND GUIDANCE VASC ACCESS RIGHT; IR EMBO ART VEN HEMORR LYMPH EXTRAV INC GUIDE ROADMAPPING; ARTERIOGRAPHY; ADDITIONAL ARTERIOGRAPHY MEDICATIONS: None ANESTHESIA/SEDATION: Moderate (conscious) sedation was employed during this procedure. A total of Versed 1.5 mg and Fentanyl 62.5 mcg was administered intravenously. Moderate Sedation Time: 90 minutes. The patient's level of consciousness and vital signs were monitored continuously by radiology nursing throughout the procedure under my direct supervision. CONTRAST:  270 cc FLUOROSCOPY TIME:  Fluoroscopy Time: 27 minutes 36 seconds (74.9 mGy). COMPLICATIONS: None immediate. PROCEDURE: Informed consent was obtained from the patient following explanation of the procedure, risks, benefits and alternatives. The patient understands, agrees and consents for the procedure. All  questions were addressed. A time out was performed prior to the initiation of the procedure. Maximal barrier sterile technique utilized including caps, mask, sterile gowns, sterile gloves, large sterile drape, hand hygiene, and Betadine prep. Ultrasound survey of the right inguinal region was performed with images stored and sent to PACs. A micropuncture needle was used access the right common femoral artery under ultrasound. With excellent arterial blood flow returned, and an .018 micro wire was passed through the needle, observed enter the abdominal aorta under fluoroscopy. The needle was removed, and a micropuncture sheath was placed over the wire. The inner dilator and wire were removed, and an 035 Bentson wire was advanced under fluoroscopy into the abdominal aorta. The sheath was removed and a standard 5 [place] vascular sheath was placed. The dilator was removed and the sheath was flushed. Standard C2 Cobra catheter was advanced over the wire into the abdomen. Cobra catheter was used to select the superior mesenteric artery. Mesenteric artery angiogram was performed. Cobra catheter was then exchanged for an Omni Flush catheter. Lower aortic angiogram and pelvic angiogram performed. Variation of catheters used to select inferior mesenteric artery. CT cobra catheter, Chung 2.5 catheter, and Roche inferior mesenteric catheter were used. The rim catheter was successful in selecting the origin of the inferior mesenteric artery. IMA angiogram performed. Micro catheter system was then advanced through the rim catheter. 105 cm micro catheter and a combination of a 14 Fathom and a synchro soft were used for selective/sub selective angiogram of branches of the superior rectal artery. Once the catheter tip was confirmed to be within abnormal fourth order vessel of the superior rectal branches, coil embolization was performed with 2 mm x 2 cm and a 2 mm x 4 cm coil. Final angiogram performed. Angiogram of the right common  femoral artery was performed. Exoseal was deployed for hemostasis. FINDINGS: Superior mesenteric artery angiogram demonstrates patent vasculature with no tumor blush, extravasation of contrast, or contrast stasis. Contribution through the middle colic artery to the gastroepiploic artery. Lower aortic angiogram demonstrates mild atherosclerotic changes with no aneurysm or dissection. Moderate atherosclerotic changes of the right common iliac artery with mild atherosclerotic changes  of the left common iliac artery. No asymmetric flow. Bilateral L5 lumbar arteries are patent. Bilateral hypogastric arteries patent. No significant atherosclerotic changes of the proximal femoral vasculature. IMA angiogram demonstrates venous shunting/contrast staining associated with fourth order branches of the superior rectal artery. Widely patent left colic artery with patent marginal artery of [place]. Status post coil embolization of abnormal fourth order branch with no ongoing contrast staining or venous shunting. IMPRESSION: Status post mesenteric artery angiogram and coil embolization of abnormal fourth order branch of superior rectal artery for treatment of acute lower GI hemorrhage. Deployment of Exoseal for right common femoral artery hemostasis. Signed, Dulcy Fanny. Earleen Newport, DO [facility] Radiology Electronically Signed   By: Corrie Mckusick D.O.   On: 06/19/2016 18:19   Ir Angiogram Follow Up Study  Result Date: 06/19/2016 INDICATION: 82 year old female with new painless lower gastrointestinal hemorrhage, positive nuclear medicine bleeding scan, and hemodynamic instability. She has been referred for evaluation of angiogram and possible therapy. EXAM: SELECTIVE VISCERAL ARTERIOGRAPHY; IR ULTRASOUND GUIDANCE VASC ACCESS RIGHT; IR EMBO ART VEN HEMORR LYMPH EXTRAV INC GUIDE ROADMAPPING; ARTERIOGRAPHY; ADDITIONAL ARTERIOGRAPHY MEDICATIONS: None ANESTHESIA/SEDATION: Moderate  (conscious) sedation was employed during this procedure. A total of Versed 1.5 mg and Fentanyl 62.5 mcg was administered intravenously. Moderate Sedation Time: 90 minutes. The patient's level of consciousness and vital signs were monitored continuously by radiology nursing throughout the procedure under my direct supervision. CONTRAST:  270 cc FLUOROSCOPY TIME:  Fluoroscopy Time: 27 minutes 36 seconds (74.9 mGy). COMPLICATIONS: None immediate. PROCEDURE: Informed consent was obtained from the patient following explanation of the procedure, risks, benefits and alternatives. The patient understands, agrees and consents for the procedure. All questions were addressed. A time out was performed prior to the initiation of the procedure. Maximal barrier sterile technique utilized including caps, mask, sterile gowns, sterile gloves, large sterile drape, hand hygiene, and Betadine prep. Ultrasound survey of the right inguinal region was performed with images stored and sent to PACs. A micropuncture needle was used access the right common femoral artery under ultrasound. With excellent arterial blood flow returned, and an .018 micro wire was passed through the needle, observed enter the abdominal aorta under fluoroscopy. The needle was removed, and a micropuncture sheath was placed over the wire. The inner dilator and wire were removed, and an 035 Bentson wire was advanced under fluoroscopy into the abdominal aorta. The sheath was removed and a standard 5 [place] vascular sheath was placed. The dilator was removed and the sheath was flushed. Standard C2 Cobra catheter was advanced over the wire into the abdomen. Cobra catheter was used to select the superior mesenteric artery. Mesenteric artery angiogram was performed. Cobra catheter was then exchanged for an Omni Flush catheter. Lower aortic angiogram and pelvic angiogram performed. Variation of catheters used to select inferior mesenteric artery. CT cobra catheter, Chung 2.5  catheter, and Roche inferior mesenteric catheter were used. The rim catheter was successful in selecting the origin of the inferior mesenteric artery. IMA angiogram performed. Micro catheter system was then advanced through the rim catheter. 105 cm micro catheter and a combination of a 14 Fathom and a synchro soft were used for selective/sub selective angiogram of branches of the superior rectal artery. Once the catheter tip was confirmed to be within abnormal fourth order vessel of the superior rectal branches, coil embolization was performed with 2 mm x 2 cm and a 2 mm x 4 cm coil. Final angiogram performed. Angiogram of the right common femoral artery  was performed. Exoseal was deployed for hemostasis. FINDINGS: Superior mesenteric artery angiogram demonstrates patent vasculature with no tumor blush, extravasation of contrast, or contrast stasis. Contribution through the middle colic artery to the gastroepiploic artery. Lower aortic angiogram demonstrates mild atherosclerotic changes with no aneurysm or dissection. Moderate atherosclerotic changes of the right common iliac artery with mild atherosclerotic changes of the left common iliac artery. No asymmetric flow. Bilateral L5 lumbar arteries are patent. Bilateral hypogastric arteries patent. No significant atherosclerotic changes of the proximal femoral vasculature. IMA angiogram demonstrates venous shunting/contrast staining associated with fourth order branches of the superior rectal artery. Widely patent left colic artery with patent marginal artery of [place]. Status post coil embolization of abnormal fourth order branch with no ongoing contrast staining or venous shunting. IMPRESSION: Status post mesenteric artery angiogram and coil embolization of abnormal fourth order branch of superior rectal artery for treatment of acute lower GI hemorrhage. Deployment of Exoseal for right common femoral artery hemostasis. Signed, Dulcy Fanny. Earleen Newport, DO [facility] Radiology Electronically Signed   By: Corrie Mckusick D.O.   On: 06/19/2016 18:19   Ir US Guide Vasc Access Right  Result Date: 06/19/2016 INDICATION: 82 year old female with new painless lower gastrointestinal hemorrhage, positive nuclear medicine bleeding scan, and hemodynamic instability. She has been referred for evaluation of angiogram and possible therapy. EXAM: SELECTIVE VISCERAL ARTERIOGRAPHY; IR ULTRASOUND GUIDANCE VASC ACCESS RIGHT; IR EMBO ART VEN HEMORR LYMPH EXTRAV INC GUIDE ROADMAPPING; ARTERIOGRAPHY; ADDITIONAL ARTERIOGRAPHY MEDICATIONS: None ANESTHESIA/SEDATION: Moderate (conscious) sedation was employed during this procedure. A total of Versed 1.5 mg and Fentanyl 62.5 mcg was administered intravenously. Moderate Sedation Time: 90 minutes. The patient's level of consciousness and vital signs were monitored continuously by radiology nursing throughout the procedure under my direct supervision. CONTRAST:  270 cc FLUOROSCOPY TIME:  Fluoroscopy Time: 27 minutes 36 seconds (74.9 mGy). COMPLICATIONS: None immediate. PROCEDURE: Informed consent was obtained from the patient following explanation of the procedure, risks, benefits and alternatives. The patient understands, agrees and consents for the procedure. All questions were addressed. A time out was performed prior to the initiation of the procedure. Maximal barrier sterile technique utilized including caps, mask, sterile gowns, sterile gloves, large sterile drape, hand hygiene, and Betadine prep. Ultrasound survey of the right inguinal region was performed with images stored and sent to PACs. A micropuncture needle was used access the right common femoral artery under ultrasound. With excellent arterial blood flow returned, and an .018 micro wire was passed through the needle, observed enter the abdominal aorta under fluoroscopy. The needle was removed, and a micropuncture sheath was placed over the  wire. The inner dilator and wire were removed, and an 035 Bentson wire was advanced under fluoroscopy into the abdominal aorta. The sheath was removed and a standard 5 [place] vascular sheath was placed. The dilator was removed and the sheath was flushed. Standard C2 Cobra catheter was advanced over the wire into the abdomen. Cobra catheter was used to select the superior mesenteric artery. Mesenteric artery angiogram was performed. Cobra catheter was then exchanged for an Omni Flush catheter. Lower aortic angiogram and pelvic angiogram performed. Variation of catheters used to select inferior mesenteric artery. CT cobra catheter, Chung 2.5 catheter, and Roche inferior mesenteric catheter were used. The rim catheter was successful in selecting the origin of the inferior mesenteric artery. IMA angiogram performed. Micro catheter system was then advanced through the rim catheter. 105 cm micro catheter and a combination of a 14 Fathom and a  synchro soft were used for selective/sub selective angiogram of branches of the superior rectal artery. Once the catheter tip was confirmed to be within abnormal fourth order vessel of the superior rectal branches, coil embolization was performed with 2 mm x 2 cm and a 2 mm x 4 cm coil. Final angiogram performed. Angiogram of the right common femoral artery was performed. Exoseal was deployed for hemostasis. FINDINGS: Superior mesenteric artery angiogram demonstrates patent vasculature with no tumor blush, extravasation of contrast, or contrast stasis. Contribution through the middle colic artery to the gastroepiploic artery. Lower aortic angiogram demonstrates mild atherosclerotic changes with no aneurysm or dissection. Moderate atherosclerotic changes of the right common iliac artery with mild atherosclerotic changes of the left common iliac artery. No asymmetric flow. Bilateral L5 lumbar arteries are patent. Bilateral hypogastric arteries patent. No significant atherosclerotic  changes of the proximal femoral vasculature. IMA angiogram demonstrates venous shunting/contrast staining associated with fourth order branches of the superior rectal artery. Widely patent left colic artery with patent marginal artery of [place]. Status post coil embolization of abnormal fourth order branch with no ongoing contrast staining or venous shunting. IMPRESSION: Status post mesenteric artery angiogram and coil embolization of abnormal fourth order branch of superior rectal artery for treatment of acute lower GI hemorrhage. Deployment of Exoseal for right common femoral artery hemostasis. Signed, Dulcy Fanny. Earleen Newport, DO [facility] Radiology Electronically Signed   By: Corrie Mckusick D.O.   On: 06/19/2016 18:19   Ir Embo Art  [place] Guide Roadmapping  Result Date: 06/19/2016 INDICATION: 82 year old female with new painless lower gastrointestinal hemorrhage, positive nuclear medicine bleeding scan, and hemodynamic instability. She has been referred for evaluation of angiogram and possible therapy. EXAM: SELECTIVE VISCERAL ARTERIOGRAPHY; IR ULTRASOUND GUIDANCE VASC ACCESS RIGHT; IR EMBO ART VEN HEMORR LYMPH EXTRAV INC GUIDE ROADMAPPING; ARTERIOGRAPHY; ADDITIONAL ARTERIOGRAPHY MEDICATIONS: None ANESTHESIA/SEDATION: Moderate (conscious) sedation was employed during this procedure. A total of Versed 1.5 mg and Fentanyl 62.5 mcg was administered intravenously. Moderate Sedation Time: 90 minutes. The patient's level of consciousness and vital signs were monitored continuously by radiology nursing throughout the procedure under my direct supervision. CONTRAST:  270 cc FLUOROSCOPY TIME:  Fluoroscopy Time: 27 minutes 36 seconds (74.9 mGy). COMPLICATIONS: None immediate. PROCEDURE: Informed consent was obtained from the patient following explanation of the procedure, risks, benefits and alternatives. The patient understands, agrees and consents  for the procedure. All questions were addressed. A time out was performed prior to the initiation of the procedure. Maximal barrier sterile technique utilized including caps, mask, sterile gowns, sterile gloves, large sterile drape, hand hygiene, and Betadine prep. Ultrasound survey of the right inguinal region was performed with images stored and sent to PACs. A micropuncture needle was used access the right common femoral artery under ultrasound. With excellent arterial blood flow returned, and an .018 micro wire was passed through the needle, observed enter the abdominal aorta under fluoroscopy. The needle was removed, and a micropuncture sheath was placed over the wire. The inner dilator and wire were removed, and an 035 Bentson wire was advanced under fluoroscopy into the abdominal aorta. The sheath was removed and a standard 5 [place] vascular sheath was placed. The dilator was removed and the sheath was flushed. Standard C2 Cobra catheter was advanced over the wire into the abdomen. Cobra catheter was used to select the superior mesenteric artery. Mesenteric artery angiogram was performed. Cobra catheter was then exchanged for an Omni Flush catheter. Lower aortic  angiogram and pelvic angiogram performed. Variation of catheters used to select inferior mesenteric artery. CT cobra catheter, Chung 2.5 catheter, and Roche inferior mesenteric catheter were used. The rim catheter was successful in selecting the origin of the inferior mesenteric artery. IMA angiogram performed. Micro catheter system was then advanced through the rim catheter. 105 cm micro catheter and a combination of a 14 Fathom and a synchro soft were used for selective/sub selective angiogram of branches of the superior rectal artery. Once the catheter tip was confirmed to be within abnormal fourth order vessel of the superior rectal branches, coil embolization was performed with 2 mm x 2 cm and a 2 mm x 4 cm coil. Final angiogram performed.  Angiogram of the right common femoral artery was performed. Exoseal was deployed for hemostasis. FINDINGS: Superior mesenteric artery angiogram demonstrates patent vasculature with no tumor blush, extravasation of contrast, or contrast stasis. Contribution through the middle colic artery to the gastroepiploic artery. Lower aortic angiogram demonstrates mild atherosclerotic changes with no aneurysm or dissection. Moderate atherosclerotic changes of the right common iliac artery with mild atherosclerotic changes of the left common iliac artery. No asymmetric flow. Bilateral L5 lumbar arteries are patent. Bilateral hypogastric arteries patent. No significant atherosclerotic changes of the proximal femoral vasculature. IMA angiogram demonstrates venous shunting/contrast staining associated with fourth order branches of the superior rectal artery. Widely patent left colic artery with patent marginal artery of [place]. Status post coil embolization of abnormal fourth order branch with no ongoing contrast staining or venous shunting. IMPRESSION: Status post mesenteric artery angiogram and coil embolization of abnormal fourth order branch of superior rectal artery for treatment of acute lower GI hemorrhage. Deployment of Exoseal for right common femoral artery hemostasis. Signed, Dulcy Fanny. Earleen Newport, DO [facility] Radiology Electronically Signed   By: Corrie Mckusick D.O.   On: 06/19/2016 18:19    Labs:  CBC:  Recent Labs  06/18/16 1818 06/19/16 0339 06/20/16 0327 06/21/16 0449  WBC 5.4 5.6 11.2* 7.0  HGB 9.3* 10.4* 6.0* 10.0*  HCT 27.1* 29.5* 17.2* 27.7*  PLT 201 162 139* 121*    COAGS:  Recent Labs  06/18/16 0627  INR 0.99  APTT 26    BMP:  Recent Labs  06/18/16 0558 06/18/16 0943 06/19/16 0339 06/20/16 0327 06/21/16 0449  NA 132* 131* 131* 135 136  K 4.6 4.1 3.9 4.0 3.8  CL 98* 99* 104 109 108  CO2 25  --  22 17* 22  GLUCOSE 135* 111*  106* 110* 93  BUN 23* 21* 18 21* 14  CALCIUM 9.6  --  8.2* 7.8* 8.1*  CREATININE 0.93 0.90 0.82 0.81 0.78  GFRNONAA 53*  --  >60 >60 >60  GFRAA >60  --  >60 >60 >60    LIVER FUNCTION TESTS:  Recent Labs  06/18/16 0558  BILITOT 0.3  AST 29  ALT 16  ALKPHOS 32*  PROT 6.6  ALBUMIN 4.0    Assessment and Plan: Hx GI bleed; s/p mesenteric angiogram of SMA & IMA, selective angiogram of superior rectal branches, coil embo of abnormal branch from the superior rectal by Dr. Earleen Newport 11/27; VSS;AF; hgb 10(6.0), creat .78;  further plans as per IM/GI  Electronically Signed: D. Rowe Robert 06/21/2016, 9:38 AM   I spent a total of 15 minutes at the the patient's bedside AND on the patient's hospital floor or unit, greater than 50% of which was counseling/coordinating care for mesenteric arteriogram with embolization    Patient  ID: Alicia Gomez, female   DOB: 24-May-1926, 82 y.o.   MRN: [id number]

## 2016-06-21 NOTE — Progress Notes (Signed)
Patient alert and oriented, denies pain, v/s stable. D/c instruction explain and given to the patient and family, all questions answered, patient and family verbalized understanding. Pt. D/c per order.

## 2016-06-21 NOTE — Progress Notes (Signed)
Patient with quiet night, no complaints.  RN did notice a small amount of bright red blood on pads when changing patient presumably from rectum.  No stool during the night.

## 2016-06-21 NOTE — Discharge Summary (Addendum)
Physician Discharge Summary  Alicia Gomez [id number] DOB: [birth year]  PCP: Jani Gravel, MD  Admit date: 06/18/2016 Discharge date: 06/21/2016  Recommendations for Outpatient Follow-up:  1. Dr. Jani Gravel, PCP in 3 days with repeat labs (CBC & BMP).Please determine timing of resumption of her aspirin which was held in the hospital secondary to significant lower GI bleed. 2. Dr. Peter Martinique, Cardiology  Home Health: None. Patient declined evaluation by PT or home health services. Equipment/Devices: None    Discharge Condition: Improved and stable.  CODE STATUS: Full  Diet recommendation: Heart healthy diet.  Discharge Diagnoses:  Active Problems:   Atrial fibrillation (HCC)   Hypertension   Dyslipidemia   Chronic systolic CHF (congestive heart failure) (HCC)   GI bleed   Symptomatic anemia   Gastrointestinal hemorrhage   Hyponatremia   Acute blood loss anemia   Lower GI bleed   Brief/Interim Summary: 82 year old female with a history of systolic CHF, hypertension, hyperlipidemia, remote atrial fibrillation presented with painless hematochezia that began on 06/17/2016. On the morning of 06/18/2016, the patient woke up with blood on her clothing and bedding. As result, EMS was activated, the patient was brought to the hospital for further evaluation. The patient did not have any fevers, chills, abdominal pain, vomiting, dysuria, hematuria. Apparently, the patient had a colonoscopy over 10 years ago which showed diverticulosis. Upon presentation, the patient was noted have hemoglobin of 8.7. Alexander Mt consulted to assist with management. The patient denies any long-term use of NSAIDs.  Assessment/Plan: Lower GI bleed -Likely secondary to diverticular bleed -Patient had 3 more large episodes of hematochezia AM 06/19/2016 -Appreciate GI follow-up -06/19/16--NM RBC scan--GI bleed beginning in the left lower abdomen/ upper pelvis, thought to originate in the descending  colon -06/19/16--IR was consulted and patient is status post mesenteric angiogram of SMA and IMA, selective angiogram of superior rectal branches, coil embolization of abnormal branch from the superior rectal. -06/20/16--transfused 2 units PRBC, scant hematochezia -discussed with GI--advance diet, monitor Hgb, home 11/29 if stable - Since yesterday, no significant rectal bleeding. As per RN, has smidge of blood on her diaper. IR follow-up appreciated and no signs of further significant bleeding. Discussed with Dr. Penelope Coop >hold aspirin at least for 1 week in the context of recent significant GI bleed and can follow-up with PCP (no need to follow-up with GI).  Acute blood loss anemia -Baseline hemoglobin approximately 11-12 - Status post 2 units PRBC transfusion. Hemoglobin improved from 6 g on 11/28-10 g on 11/29. Secondary to rectal bleeding which has clinically resolved. Follow CBC as an outpatient in a couple of days.\  Thrombocytopenia - Possibly related to acute blood loss anemia. As indicated above, clinically her rectal bleeding seems to have abated. Follow CBC as outpatient in a couple of days. Aspirin currently held.  Chronic systolic CHF -Clinically euvolemic. Compensated.  HTN -stable -Carvedilol had been temporarily held secondary to rectal bleeding and concern for hypotension. Blood pressures stable or increasing. Resume carvedilol at discharge.  Hyponatremia -due to volume depletion -Resolved after volume resuscitation.  PAF -"resolved years ago" per pt. -presently in sinus -holding ASA for now for at least a week and can be reassessed during outpatient follow-up with PCP regarding resumption. -11/28 Echo--EF 55-60%, grade 2 DD, mod TR, PASP 48  Peripheral neuropathy: - continue neurontin  Hyperglycemia -check A1C as outpatient. Apparently was never sent in the hospital.   Discharge Instructions  Discharge Instructions    (HEART FAILURE PATIENTS) Call MD:   Anytime you  have any of the following symptoms: 1) 3 pound weight gain in 24 hours or 5 pounds in 1 week 2) shortness of breath, with or without a dry hacking cough 3) swelling in the hands, feet or stomach 4) if you have to sleep on extra pillows at night in order to breathe.    Complete by:  As directed    Call MD for:    Complete by:  As directed    Recurrent rectal bleeding.   Call MD for:  difficulty breathing, headache or visual disturbances    Complete by:  As directed    Call MD for:  extreme fatigue    Complete by:  As directed    Call MD for:  persistant dizziness or light-headedness    Complete by:  As directed    Diet - low sodium heart healthy    Complete by:  As directed    Increase activity slowly    Complete by:  As directed        Medication List    STOP taking these medications   aspirin 81 MG tablet     TAKE these medications   acetaminophen 650 MG CR tablet Commonly known as:  TYLENOL 8 HOUR Take 1 tablet (650 mg total) by mouth every 8 (eight) hours as needed for pain.   bimatoprost 0.03 % ophthalmic solution Commonly known as:  LUMIGAN Place 1 drop into both eyes at bedtime.   carvedilol 25 MG tablet Commonly known as:  COREG Take 12.5 mg by mouth 2 (two) times daily with a meal.   Cinnamon 500 MG Tabs Take 1 tablet by mouth 1 day or 1 dose.   gabapentin 100 MG capsule Commonly known as:  NEURONTIN Take 100 mg by mouth 2 (two) times daily.   meclizine 25 MG tablet Commonly known as:  ANTIVERT Take 25 mg by mouth 2 (two) times daily.   timolol 0.5 % ophthalmic solution Commonly known as:  BETIMOL Place 1 drop into both eyes 2 (two) times daily.   VITAMIN D PO Take 1 tablet by mouth 2 (two) times daily.      Follow-up Information    Jani Gravel, MD. Schedule an appointment as soon as possible for a visit in 3 day(s).   Specialty:  Internal Medicine Why:  To be seen with repeat labs (CBC & BMP). Contact information: [address] [address] [phone]        Peter Martinique, MD. Schedule an appointment as soon as possible for a visit.   Specialty:  Cardiology Contact information: [address] [place] [address] [phone]          Allergies  Allergen Reactions  . Codeine Nausea And Vomiting    Consultations:  [facility]  Interventional radiology.   Procedures/Studies: Nm Gi Blood Loss  Result Date: 06/19/2016 CLINICAL DATA:  GI bleed. EXAM: NUCLEAR MEDICINE GASTROINTESTINAL BLEEDING SCAN TECHNIQUE: Sequential abdominal images were obtained following intravenous administration of Tc-29m labeled red blood cells. RADIOPHARMACEUTICALS:  25.7 mCi Tc-64m in-vitro labeled red cells. COMPARISON:  None. FINDINGS: The study is positive for a GI bleed beginning in the left lower abdomen/ upper pelvis. The patient was very agitated with significant motion precluding further imaging. However, the bleed appears to originate in the descending colon. IMPRESSION: GI bleed beginning in the left lower abdomen/ upper pelvis, thought to originate in the descending colon. These results will be called to the ordering clinician or representative by  the Radiologist Assistant, and communication documented in the PACS or zVision Dashboard. Electronically Signed   By: Dorise Bullion III M.D   On: 06/19/2016 13:00   Ir Angiogram Visceral Selective  Result Date: 06/19/2016 INDICATION: 82 year old female with new painless lower gastrointestinal hemorrhage, positive nuclear medicine bleeding scan, and hemodynamic instability. She has been referred for evaluation of angiogram and possible therapy. EXAM: SELECTIVE VISCERAL ARTERIOGRAPHY; IR ULTRASOUND GUIDANCE VASC ACCESS RIGHT; IR EMBO ART VEN HEMORR LYMPH EXTRAV INC GUIDE ROADMAPPING; ARTERIOGRAPHY; ADDITIONAL ARTERIOGRAPHY MEDICATIONS: None ANESTHESIA/SEDATION: Moderate (conscious) sedation was employed during this procedure. A total of  Versed 1.5 mg and Fentanyl 62.5 mcg was administered intravenously. Moderate Sedation Time: 90 minutes. The patient's level of consciousness and vital signs were monitored continuously by radiology nursing throughout the procedure under my direct supervision. CONTRAST:  270 cc FLUOROSCOPY TIME:  Fluoroscopy Time: 27 minutes 36 seconds (74.9 mGy). COMPLICATIONS: None immediate. PROCEDURE: Informed consent was obtained from the patient following explanation of the procedure, risks, benefits and alternatives. The patient understands, agrees and consents for the procedure. All questions were addressed. A time out was performed prior to the initiation of the procedure. Maximal barrier sterile technique utilized including caps, mask, sterile gowns, sterile gloves, large sterile drape, hand hygiene, and Betadine prep. Ultrasound survey of the right inguinal region was performed with images stored and sent to PACs. A micropuncture needle was used access the right common femoral artery under ultrasound. With excellent arterial blood flow returned, and an .018 micro wire was passed through the needle, observed enter the abdominal aorta under fluoroscopy. The needle was removed, and a micropuncture sheath was placed over the wire. The inner dilator and wire were removed, and an 035 Bentson wire was advanced under fluoroscopy into the abdominal aorta. The sheath was removed and a standard 5 [place] vascular sheath was placed. The dilator was removed and the sheath was flushed. Standard C2 Cobra catheter was advanced over the wire into the abdomen. Cobra catheter was used to select the superior mesenteric artery. Mesenteric artery angiogram was performed. Cobra catheter was then exchanged for an Omni Flush catheter. Lower aortic angiogram and pelvic angiogram performed. Variation of catheters used to select inferior mesenteric artery. CT cobra catheter, Chung 2.5 catheter, and Roche inferior mesenteric catheter were used. The rim  catheter was successful in selecting the origin of the inferior mesenteric artery. IMA angiogram performed. Micro catheter system was then advanced through the rim catheter. 105 cm micro catheter and a combination of a 14 Fathom and a synchro soft were used for selective/sub selective angiogram of branches of the superior rectal artery. Once the catheter tip was confirmed to be within abnormal fourth order vessel of the superior rectal branches, coil embolization was performed with 2 mm x 2 cm and a 2 mm x 4 cm coil. Final angiogram performed. Angiogram of the right common femoral artery was performed. Exoseal was deployed for hemostasis. FINDINGS: Superior mesenteric artery angiogram demonstrates patent vasculature with no tumor blush, extravasation of contrast, or contrast stasis. Contribution through the middle colic artery to the gastroepiploic artery. Lower aortic angiogram demonstrates mild atherosclerotic changes with no aneurysm or dissection. Moderate atherosclerotic changes of the right common iliac artery with mild atherosclerotic changes of the left common iliac artery. No asymmetric flow. Bilateral L5 lumbar arteries are patent. Bilateral hypogastric arteries patent. No significant atherosclerotic changes of the proximal femoral vasculature. IMA angiogram demonstrates venous shunting/contrast staining associated with fourth order branches of the superior rectal artery. Widely patent  left colic artery with patent marginal artery of [place]. Status post coil embolization of abnormal fourth order branch with no ongoing contrast staining or venous shunting. IMPRESSION: Status post mesenteric artery angiogram and coil embolization of abnormal fourth order branch of superior rectal artery for treatment of acute lower GI hemorrhage. Deployment of Exoseal for right common femoral artery hemostasis. Signed, Dulcy Fanny. Earleen Newport, DO [facility] Radiology Electronically  Signed   By: Corrie Mckusick D.O.   On: 06/19/2016 18:19   Ir Angiogram Visceral Selective  Result Date: 06/19/2016 INDICATION: 82 year old female with new painless lower gastrointestinal hemorrhage, positive nuclear medicine bleeding scan, and hemodynamic instability. She has been referred for evaluation of angiogram and possible therapy. EXAM: SELECTIVE VISCERAL ARTERIOGRAPHY; IR ULTRASOUND GUIDANCE VASC ACCESS RIGHT; IR EMBO ART VEN HEMORR LYMPH EXTRAV INC GUIDE ROADMAPPING; ARTERIOGRAPHY; ADDITIONAL ARTERIOGRAPHY MEDICATIONS: None ANESTHESIA/SEDATION: Moderate (conscious) sedation was employed during this procedure. A total of Versed 1.5 mg and Fentanyl 62.5 mcg was administered intravenously. Moderate Sedation Time: 90 minutes. The patient's level of consciousness and vital signs were monitored continuously by radiology nursing throughout the procedure under my direct supervision. CONTRAST:  270 cc FLUOROSCOPY TIME:  Fluoroscopy Time: 27 minutes 36 seconds (74.9 mGy). COMPLICATIONS: None immediate. PROCEDURE: Informed consent was obtained from the patient following explanation of the procedure, risks, benefits and alternatives. The patient understands, agrees and consents for the procedure. All questions were addressed. A time out was performed prior to the initiation of the procedure. Maximal barrier sterile technique utilized including caps, mask, sterile gowns, sterile gloves, large sterile drape, hand hygiene, and Betadine prep. Ultrasound survey of the right inguinal region was performed with images stored and sent to PACs. A micropuncture needle was used access the right common femoral artery under ultrasound. With excellent arterial blood flow returned, and an .018 micro wire was passed through the needle, observed enter the abdominal aorta under fluoroscopy. The needle was removed, and a micropuncture sheath was placed over the wire. The inner dilator and wire were removed, and an 035 Bentson wire  was advanced under fluoroscopy into the abdominal aorta. The sheath was removed and a standard 5 [place] vascular sheath was placed. The dilator was removed and the sheath was flushed. Standard C2 Cobra catheter was advanced over the wire into the abdomen. Cobra catheter was used to select the superior mesenteric artery. Mesenteric artery angiogram was performed. Cobra catheter was then exchanged for an Omni Flush catheter. Lower aortic angiogram and pelvic angiogram performed. Variation of catheters used to select inferior mesenteric artery. CT cobra catheter, Chung 2.5 catheter, and Roche inferior mesenteric catheter were used. The rim catheter was successful in selecting the origin of the inferior mesenteric artery. IMA angiogram performed. Micro catheter system was then advanced through the rim catheter. 105 cm micro catheter and a combination of a 14 Fathom and a synchro soft were used for selective/sub selective angiogram of branches of the superior rectal artery. Once the catheter tip was confirmed to be within abnormal fourth order vessel of the superior rectal branches, coil embolization was performed with 2 mm x 2 cm and a 2 mm x 4 cm coil. Final angiogram performed. Angiogram of the right common femoral artery was performed. Exoseal was deployed for hemostasis. FINDINGS: Superior mesenteric artery angiogram demonstrates patent vasculature with no tumor blush, extravasation of contrast, or contrast stasis. Contribution through the middle colic artery to the gastroepiploic artery. Lower aortic angiogram demonstrates mild atherosclerotic changes with no aneurysm or dissection.  Moderate atherosclerotic changes of the right common iliac artery with mild atherosclerotic changes of the left common iliac artery. No asymmetric flow. Bilateral L5 lumbar arteries are patent. Bilateral hypogastric arteries patent. No significant atherosclerotic changes of the proximal femoral vasculature. IMA angiogram demonstrates  venous shunting/contrast staining associated with fourth order branches of the superior rectal artery. Widely patent left colic artery with patent marginal artery of [place]. Status post coil embolization of abnormal fourth order branch with no ongoing contrast staining or venous shunting. IMPRESSION: Status post mesenteric artery angiogram and coil embolization of abnormal fourth order branch of superior rectal artery for treatment of acute lower GI hemorrhage. Deployment of Exoseal for right common femoral artery hemostasis. Signed, Dulcy Fanny. Earleen Newport, DO [facility] Radiology Electronically Signed   By: Corrie Mckusick D.O.   On: 06/19/2016 18:19   Ir Angiogram Selective Each Additional Vessel  Result Date: 06/19/2016 INDICATION: 82 year old female with new painless lower gastrointestinal hemorrhage, positive nuclear medicine bleeding scan, and hemodynamic instability. She has been referred for evaluation of angiogram and possible therapy. EXAM: SELECTIVE VISCERAL ARTERIOGRAPHY; IR ULTRASOUND GUIDANCE VASC ACCESS RIGHT; IR EMBO ART VEN HEMORR LYMPH EXTRAV INC GUIDE ROADMAPPING; ARTERIOGRAPHY; ADDITIONAL ARTERIOGRAPHY MEDICATIONS: None ANESTHESIA/SEDATION: Moderate (conscious) sedation was employed during this procedure. A total of Versed 1.5 mg and Fentanyl 62.5 mcg was administered intravenously. Moderate Sedation Time: 90 minutes. The patient's level of consciousness and vital signs were monitored continuously by radiology nursing throughout the procedure under my direct supervision. CONTRAST:  270 cc FLUOROSCOPY TIME:  Fluoroscopy Time: 27 minutes 36 seconds (74.9 mGy). COMPLICATIONS: None immediate. PROCEDURE: Informed consent was obtained from the patient following explanation of the procedure, risks, benefits and alternatives. The patient understands, agrees and consents for the procedure. All questions were addressed. A time out was performed prior to the  initiation of the procedure. Maximal barrier sterile technique utilized including caps, mask, sterile gowns, sterile gloves, large sterile drape, hand hygiene, and Betadine prep. Ultrasound survey of the right inguinal region was performed with images stored and sent to PACs. A micropuncture needle was used access the right common femoral artery under ultrasound. With excellent arterial blood flow returned, and an .018 micro wire was passed through the needle, observed enter the abdominal aorta under fluoroscopy. The needle was removed, and a micropuncture sheath was placed over the wire. The inner dilator and wire were removed, and an 035 Bentson wire was advanced under fluoroscopy into the abdominal aorta. The sheath was removed and a standard 5 [place] vascular sheath was placed. The dilator was removed and the sheath was flushed. Standard C2 Cobra catheter was advanced over the wire into the abdomen. Cobra catheter was used to select the superior mesenteric artery. Mesenteric artery angiogram was performed. Cobra catheter was then exchanged for an Omni Flush catheter. Lower aortic angiogram and pelvic angiogram performed. Variation of catheters used to select inferior mesenteric artery. CT cobra catheter, Chung 2.5 catheter, and Roche inferior mesenteric catheter were used. The rim catheter was successful in selecting the origin of the inferior mesenteric artery. IMA angiogram performed. Micro catheter system was then advanced through the rim catheter. 105 cm micro catheter and a combination of a 14 Fathom and a synchro soft were used for selective/sub selective angiogram of branches of the superior rectal artery. Once the catheter tip was confirmed to be within abnormal fourth order vessel of the superior rectal branches, coil embolization was performed with 2 mm x 2 cm and a 2 mm  x 4 cm coil. Final angiogram performed. Angiogram of the right common femoral artery was performed. Exoseal was deployed for  hemostasis. FINDINGS: Superior mesenteric artery angiogram demonstrates patent vasculature with no tumor blush, extravasation of contrast, or contrast stasis. Contribution through the middle colic artery to the gastroepiploic artery. Lower aortic angiogram demonstrates mild atherosclerotic changes with no aneurysm or dissection. Moderate atherosclerotic changes of the right common iliac artery with mild atherosclerotic changes of the left common iliac artery. No asymmetric flow. Bilateral L5 lumbar arteries are patent. Bilateral hypogastric arteries patent. No significant atherosclerotic changes of the proximal femoral vasculature. IMA angiogram demonstrates venous shunting/contrast staining associated with fourth order branches of the superior rectal artery. Widely patent left colic artery with patent marginal artery of [place]. Status post coil embolization of abnormal fourth order branch with no ongoing contrast staining or venous shunting. IMPRESSION: Status post mesenteric artery angiogram and coil embolization of abnormal fourth order branch of superior rectal artery for treatment of acute lower GI hemorrhage. Deployment of Exoseal for right common femoral artery hemostasis. Signed, Dulcy Fanny. Earleen Newport, DO [facility] Radiology Electronically Signed   By: Corrie Mckusick D.O.   On: 06/19/2016 18:19   Ir Angiogram Selective Each Additional Vessel  Result Date: 06/19/2016 INDICATION: 82 year old female with new painless lower gastrointestinal hemorrhage, positive nuclear medicine bleeding scan, and hemodynamic instability. She has been referred for evaluation of angiogram and possible therapy. EXAM: SELECTIVE VISCERAL ARTERIOGRAPHY; IR ULTRASOUND GUIDANCE VASC ACCESS RIGHT; IR EMBO ART VEN HEMORR LYMPH EXTRAV INC GUIDE ROADMAPPING; ARTERIOGRAPHY; ADDITIONAL ARTERIOGRAPHY MEDICATIONS: None ANESTHESIA/SEDATION: Moderate (conscious) sedation was employed during this  procedure. A total of Versed 1.5 mg and Fentanyl 62.5 mcg was administered intravenously. Moderate Sedation Time: 90 minutes. The patient's level of consciousness and vital signs were monitored continuously by radiology nursing throughout the procedure under my direct supervision. CONTRAST:  270 cc FLUOROSCOPY TIME:  Fluoroscopy Time: 27 minutes 36 seconds (74.9 mGy). COMPLICATIONS: None immediate. PROCEDURE: Informed consent was obtained from the patient following explanation of the procedure, risks, benefits and alternatives. The patient understands, agrees and consents for the procedure. All questions were addressed. A time out was performed prior to the initiation of the procedure. Maximal barrier sterile technique utilized including caps, mask, sterile gowns, sterile gloves, large sterile drape, hand hygiene, and Betadine prep. Ultrasound survey of the right inguinal region was performed with images stored and sent to PACs. A micropuncture needle was used access the right common femoral artery under ultrasound. With excellent arterial blood flow returned, and an .018 micro wire was passed through the needle, observed enter the abdominal aorta under fluoroscopy. The needle was removed, and a micropuncture sheath was placed over the wire. The inner dilator and wire were removed, and an 035 Bentson wire was advanced under fluoroscopy into the abdominal aorta. The sheath was removed and a standard 5 [place] vascular sheath was placed. The dilator was removed and the sheath was flushed. Standard C2 Cobra catheter was advanced over the wire into the abdomen. Cobra catheter was used to select the superior mesenteric artery. Mesenteric artery angiogram was performed. Cobra catheter was then exchanged for an Omni Flush catheter. Lower aortic angiogram and pelvic angiogram performed. Variation of catheters used to select inferior mesenteric artery. CT cobra catheter, Chung 2.5 catheter, and Roche inferior mesenteric  catheter were used. The rim catheter was successful in selecting the origin of the inferior mesenteric artery. IMA angiogram performed. Micro catheter system was then advanced through the  rim catheter. 105 cm micro catheter and a combination of a 14 Fathom and a synchro soft were used for selective/sub selective angiogram of branches of the superior rectal artery. Once the catheter tip was confirmed to be within abnormal fourth order vessel of the superior rectal branches, coil embolization was performed with 2 mm x 2 cm and a 2 mm x 4 cm coil. Final angiogram performed. Angiogram of the right common femoral artery was performed. Exoseal was deployed for hemostasis. FINDINGS: Superior mesenteric artery angiogram demonstrates patent vasculature with no tumor blush, extravasation of contrast, or contrast stasis. Contribution through the middle colic artery to the gastroepiploic artery. Lower aortic angiogram demonstrates mild atherosclerotic changes with no aneurysm or dissection. Moderate atherosclerotic changes of the right common iliac artery with mild atherosclerotic changes of the left common iliac artery. No asymmetric flow. Bilateral L5 lumbar arteries are patent. Bilateral hypogastric arteries patent. No significant atherosclerotic changes of the proximal femoral vasculature. IMA angiogram demonstrates venous shunting/contrast staining associated with fourth order branches of the superior rectal artery. Widely patent left colic artery with patent marginal artery of [place]. Status post coil embolization of abnormal fourth order branch with no ongoing contrast staining or venous shunting. IMPRESSION: Status post mesenteric artery angiogram and coil embolization of abnormal fourth order branch of superior rectal artery for treatment of acute lower GI hemorrhage. Deployment of Exoseal for right common femoral artery hemostasis. Signed, Dulcy Fanny. Earleen Newport, DO [facility]  [facility] Radiology Electronically Signed   By: Corrie Mckusick D.O.   On: 06/19/2016 18:19   Ir Angiogram Follow Up Study  Result Date: 06/19/2016 INDICATION: 82 year old female with new painless lower gastrointestinal hemorrhage, positive nuclear medicine bleeding scan, and hemodynamic instability. She has been referred for evaluation of angiogram and possible therapy. EXAM: SELECTIVE VISCERAL ARTERIOGRAPHY; IR ULTRASOUND GUIDANCE VASC ACCESS RIGHT; IR EMBO ART VEN HEMORR LYMPH EXTRAV INC GUIDE ROADMAPPING; ARTERIOGRAPHY; ADDITIONAL ARTERIOGRAPHY MEDICATIONS: None ANESTHESIA/SEDATION: Moderate (conscious) sedation was employed during this procedure. A total of Versed 1.5 mg and Fentanyl 62.5 mcg was administered intravenously. Moderate Sedation Time: 90 minutes. The patient's level of consciousness and vital signs were monitored continuously by radiology nursing throughout the procedure under my direct supervision. CONTRAST:  270 cc FLUOROSCOPY TIME:  Fluoroscopy Time: 27 minutes 36 seconds (74.9 mGy). COMPLICATIONS: None immediate. PROCEDURE: Informed consent was obtained from the patient following explanation of the procedure, risks, benefits and alternatives. The patient understands, agrees and consents for the procedure. All questions were addressed. A time out was performed prior to the initiation of the procedure. Maximal barrier sterile technique utilized including caps, mask, sterile gowns, sterile gloves, large sterile drape, hand hygiene, and Betadine prep. Ultrasound survey of the right inguinal region was performed with images stored and sent to PACs. A micropuncture needle was used access the right common femoral artery under ultrasound. With excellent arterial blood flow returned, and an .018 micro wire was passed through the needle, observed enter the abdominal aorta under fluoroscopy. The needle was removed, and a micropuncture sheath was placed over the wire. The inner dilator and wire were  removed, and an 035 Bentson wire was advanced under fluoroscopy into the abdominal aorta. The sheath was removed and a standard 5 [place] vascular sheath was placed. The dilator was removed and the sheath was flushed. Standard C2 Cobra catheter was advanced over the wire into the abdomen. Cobra catheter was used to select the superior mesenteric artery. Mesenteric artery angiogram was performed. Cobra catheter was then  exchanged for an Omni Flush catheter. Lower aortic angiogram and pelvic angiogram performed. Variation of catheters used to select inferior mesenteric artery. CT cobra catheter, Chung 2.5 catheter, and Roche inferior mesenteric catheter were used. The rim catheter was successful in selecting the origin of the inferior mesenteric artery. IMA angiogram performed. Micro catheter system was then advanced through the rim catheter. 105 cm micro catheter and a combination of a 14 Fathom and a synchro soft were used for selective/sub selective angiogram of branches of the superior rectal artery. Once the catheter tip was confirmed to be within abnormal fourth order vessel of the superior rectal branches, coil embolization was performed with 2 mm x 2 cm and a 2 mm x 4 cm coil. Final angiogram performed. Angiogram of the right common femoral artery was performed. Exoseal was deployed for hemostasis. FINDINGS: Superior mesenteric artery angiogram demonstrates patent vasculature with no tumor blush, extravasation of contrast, or contrast stasis. Contribution through the middle colic artery to the gastroepiploic artery. Lower aortic angiogram demonstrates mild atherosclerotic changes with no aneurysm or dissection. Moderate atherosclerotic changes of the right common iliac artery with mild atherosclerotic changes of the left common iliac artery. No asymmetric flow. Bilateral L5 lumbar arteries are patent. Bilateral hypogastric arteries patent. No significant atherosclerotic changes of the proximal femoral  vasculature. IMA angiogram demonstrates venous shunting/contrast staining associated with fourth order branches of the superior rectal artery. Widely patent left colic artery with patent marginal artery of [place]. Status post coil embolization of abnormal fourth order branch with no ongoing contrast staining or venous shunting. IMPRESSION: Status post mesenteric artery angiogram and coil embolization of abnormal fourth order branch of superior rectal artery for treatment of acute lower GI hemorrhage. Deployment of Exoseal for right common femoral artery hemostasis. Signed, Dulcy Fanny. Earleen Newport, DO [facility] Radiology Electronically Signed   By: Corrie Mckusick D.O.   On: 06/19/2016 18:19   Ir US Guide Vasc Access Right  Result Date: 06/19/2016 INDICATION: 82 year old female with new painless lower gastrointestinal hemorrhage, positive nuclear medicine bleeding scan, and hemodynamic instability. She has been referred for evaluation of angiogram and possible therapy. EXAM: SELECTIVE VISCERAL ARTERIOGRAPHY; IR ULTRASOUND GUIDANCE VASC ACCESS RIGHT; IR EMBO ART VEN HEMORR LYMPH EXTRAV INC GUIDE ROADMAPPING; ARTERIOGRAPHY; ADDITIONAL ARTERIOGRAPHY MEDICATIONS: None ANESTHESIA/SEDATION: Moderate (conscious) sedation was employed during this procedure. A total of Versed 1.5 mg and Fentanyl 62.5 mcg was administered intravenously. Moderate Sedation Time: 90 minutes. The patient's level of consciousness and vital signs were monitored continuously by radiology nursing throughout the procedure under my direct supervision. CONTRAST:  270 cc FLUOROSCOPY TIME:  Fluoroscopy Time: 27 minutes 36 seconds (74.9 mGy). COMPLICATIONS: None immediate. PROCEDURE: Informed consent was obtained from the patient following explanation of the procedure, risks, benefits and alternatives. The patient understands, agrees and consents for the procedure. All questions were addressed. A time out was  performed prior to the initiation of the procedure. Maximal barrier sterile technique utilized including caps, mask, sterile gowns, sterile gloves, large sterile drape, hand hygiene, and Betadine prep. Ultrasound survey of the right inguinal region was performed with images stored and sent to PACs. A micropuncture needle was used access the right common femoral artery under ultrasound. With excellent arterial blood flow returned, and an .018 micro wire was passed through the needle, observed enter the abdominal aorta under fluoroscopy. The needle was removed, and a micropuncture sheath was placed over the wire. The inner dilator and wire were removed, and an 035 Bentson wire  was advanced under fluoroscopy into the abdominal aorta. The sheath was removed and a standard 5 [place] vascular sheath was placed. The dilator was removed and the sheath was flushed. Standard C2 Cobra catheter was advanced over the wire into the abdomen. Cobra catheter was used to select the superior mesenteric artery. Mesenteric artery angiogram was performed. Cobra catheter was then exchanged for an Omni Flush catheter. Lower aortic angiogram and pelvic angiogram performed. Variation of catheters used to select inferior mesenteric artery. CT cobra catheter, Chung 2.5 catheter, and Roche inferior mesenteric catheter were used. The rim catheter was successful in selecting the origin of the inferior mesenteric artery. IMA angiogram performed. Micro catheter system was then advanced through the rim catheter. 105 cm micro catheter and a combination of a 14 Fathom and a synchro soft were used for selective/sub selective angiogram of branches of the superior rectal artery. Once the catheter tip was confirmed to be within abnormal fourth order vessel of the superior rectal branches, coil embolization was performed with 2 mm x 2 cm and a 2 mm x 4 cm coil. Final angiogram performed. Angiogram of the right common femoral artery was performed. Exoseal  was deployed for hemostasis. FINDINGS: Superior mesenteric artery angiogram demonstrates patent vasculature with no tumor blush, extravasation of contrast, or contrast stasis. Contribution through the middle colic artery to the gastroepiploic artery. Lower aortic angiogram demonstrates mild atherosclerotic changes with no aneurysm or dissection. Moderate atherosclerotic changes of the right common iliac artery with mild atherosclerotic changes of the left common iliac artery. No asymmetric flow. Bilateral L5 lumbar arteries are patent. Bilateral hypogastric arteries patent. No significant atherosclerotic changes of the proximal femoral vasculature. IMA angiogram demonstrates venous shunting/contrast staining associated with fourth order branches of the superior rectal artery. Widely patent left colic artery with patent marginal artery of [place]. Status post coil embolization of abnormal fourth order branch with no ongoing contrast staining or venous shunting. IMPRESSION: Status post mesenteric artery angiogram and coil embolization of abnormal fourth order branch of superior rectal artery for treatment of acute lower GI hemorrhage. Deployment of Exoseal for right common femoral artery hemostasis. Signed, Dulcy Fanny. Earleen Newport, DO [facility] Radiology Electronically Signed   By: Corrie Mckusick D.O.   On: 06/19/2016 18:19   Ir Embo Art  [place] Guide Roadmapping  Result Date: 06/19/2016 INDICATION: 82 year old female with new painless lower gastrointestinal hemorrhage, positive nuclear medicine bleeding scan, and hemodynamic instability. She has been referred for evaluation of angiogram and possible therapy. EXAM: SELECTIVE VISCERAL ARTERIOGRAPHY; IR ULTRASOUND GUIDANCE VASC ACCESS RIGHT; IR EMBO ART VEN HEMORR LYMPH EXTRAV INC GUIDE ROADMAPPING; ARTERIOGRAPHY; ADDITIONAL ARTERIOGRAPHY MEDICATIONS: None ANESTHESIA/SEDATION: Moderate (conscious)  sedation was employed during this procedure. A total of Versed 1.5 mg and Fentanyl 62.5 mcg was administered intravenously. Moderate Sedation Time: 90 minutes. The patient's level of consciousness and vital signs were monitored continuously by radiology nursing throughout the procedure under my direct supervision. CONTRAST:  270 cc FLUOROSCOPY TIME:  Fluoroscopy Time: 27 minutes 36 seconds (74.9 mGy). COMPLICATIONS: None immediate. PROCEDURE: Informed consent was obtained from the patient following explanation of the procedure, risks, benefits and alternatives. The patient understands, agrees and consents for the procedure. All questions were addressed. A time out was performed prior to the initiation of the procedure. Maximal barrier sterile technique utilized including caps, mask, sterile gowns, sterile gloves, large sterile drape, hand hygiene, and Betadine prep. Ultrasound survey of the right inguinal region was performed with  images stored and sent to PACs. A micropuncture needle was used access the right common femoral artery under ultrasound. With excellent arterial blood flow returned, and an .018 micro wire was passed through the needle, observed enter the abdominal aorta under fluoroscopy. The needle was removed, and a micropuncture sheath was placed over the wire. The inner dilator and wire were removed, and an 035 Bentson wire was advanced under fluoroscopy into the abdominal aorta. The sheath was removed and a standard 5 [place] vascular sheath was placed. The dilator was removed and the sheath was flushed. Standard C2 Cobra catheter was advanced over the wire into the abdomen. Cobra catheter was used to select the superior mesenteric artery. Mesenteric artery angiogram was performed. Cobra catheter was then exchanged for an Omni Flush catheter. Lower aortic angiogram and pelvic angiogram performed. Variation of catheters used to select inferior mesenteric artery. CT cobra catheter, Chung 2.5 catheter,  and Roche inferior mesenteric catheter were used. The rim catheter was successful in selecting the origin of the inferior mesenteric artery. IMA angiogram performed. Micro catheter system was then advanced through the rim catheter. 105 cm micro catheter and a combination of a 14 Fathom and a synchro soft were used for selective/sub selective angiogram of branches of the superior rectal artery. Once the catheter tip was confirmed to be within abnormal fourth order vessel of the superior rectal branches, coil embolization was performed with 2 mm x 2 cm and a 2 mm x 4 cm coil. Final angiogram performed. Angiogram of the right common femoral artery was performed. Exoseal was deployed for hemostasis. FINDINGS: Superior mesenteric artery angiogram demonstrates patent vasculature with no tumor blush, extravasation of contrast, or contrast stasis. Contribution through the middle colic artery to the gastroepiploic artery. Lower aortic angiogram demonstrates mild atherosclerotic changes with no aneurysm or dissection. Moderate atherosclerotic changes of the right common iliac artery with mild atherosclerotic changes of the left common iliac artery. No asymmetric flow. Bilateral L5 lumbar arteries are patent. Bilateral hypogastric arteries patent. No significant atherosclerotic changes of the proximal femoral vasculature. IMA angiogram demonstrates venous shunting/contrast staining associated with fourth order branches of the superior rectal artery. Widely patent left colic artery with patent marginal artery of [place]. Status post coil embolization of abnormal fourth order branch with no ongoing contrast staining or venous shunting. IMPRESSION: Status post mesenteric artery angiogram and coil embolization of abnormal fourth order branch of superior rectal artery for treatment of acute lower GI hemorrhage. Deployment of Exoseal for right common femoral artery hemostasis. Signed, Dulcy Fanny. Earleen Newport, DO [facility] Radiology Electronically Signed   By: Corrie Mckusick D.O.   On: 06/19/2016 18:19      Subjective: Patient denies complaints and is anxious to go home. She states that she will be going to stay with her nephew and between her nephew and sister-in-law, they will be able to provide her with 24/7 supervision and care. She denies any further rectal bleeding, nausea, vomiting or abdominal pain. Tolerating diet. No dyspnea or chest pain reported.  Discharge Exam:  Vitals:   06/20/16 2010 06/21/16 0633 06/21/16 1100 06/21/16 1144  BP: 138/70 137/67 (!) 162/69 125/76  Pulse: 83 77  75  Resp: 18 18 16 18   Temp: 98.3 F (36.8 C) 97.8 F (36.6 C) 98 F (36.7 C) 98.5 F (36.9 C)  TempSrc: Oral Oral Oral Oral  SpO2: 98% 99% 100% 93%  Weight:  41.8 kg (92 lb 3.2 oz)    Height:  General: Pt lying comfortably in bed & appears in no obvious distress.Pleasant elderly female, small built and frail. Lying comfortably supine in bed. Cardiovascular: S1 & S2 heard, RRR, S1/S2 +. No murmurs, rubs, gallops or clicks. No JVD or pedal edema. Telemetry: Sinus rhythm./Low-voltage. Respiratory: Clear to auscultation without wheezing, rhonchi or crackles. No increased work of breathing. Abdominal:  Non distended, non tender & soft. No organomegaly or masses appreciated. Normal bowel sounds heard. CNS: Alert and oriented. No focal deficits. Extremities: no edema, no cyanosis    The results of significant diagnostics from this hospitalization (including imaging, microbiology, ancillary and laboratory) are listed below for reference.     Microbiology: No results found for this or any previous visit (from the past 240 hour(s)).   Labs: BNP (last 3 results) No results for input(s): BNP in the last 8760 hours. Basic Metabolic Panel:  Recent Labs Lab 06/18/16 0558 06/18/16 0943 06/19/16 0339 06/20/16 0327 06/21/16 0449  NA 132* 131* 131* 135 136  K 4.6 4.1 3.9 4.0  3.8  CL 98* 99* 104 109 108  CO2 25  --  22 17* 22  GLUCOSE 135* 111* 106* 110* 93  BUN 23* 21* 18 21* 14  CREATININE 0.93 0.90 0.82 0.81 0.78  CALCIUM 9.6  --  8.2* 7.8* 8.1*   Liver Function Tests:  Recent Labs Lab 06/18/16 0558  AST 29  ALT 16  ALKPHOS 32*  BILITOT 0.3  PROT 6.6  ALBUMIN 4.0   No results for input(s): LIPASE, AMYLASE in the last 168 hours. No results for input(s): AMMONIA in the last 168 hours. CBC:  Recent Labs Lab 06/18/16 0934 06/18/16 0943 06/18/16 1818 06/19/16 0339 06/20/16 0327 06/21/16 0449  WBC 5.7  --  5.4 5.6 11.2* 7.0  HGB 8.7* 8.2* 9.3* 10.4* 6.0* 10.0*  HCT 25.3* 24.0* 27.1* 29.5* 17.2* 27.7*  MCV 90.7  --  89.4 86.8 87.8 82.9  PLT 225  --  201 162 139* 121*      Time coordinating discharge: Over 30 minutes  SIGNED:  Vernell Leep, MD, FACP, FHM. [facility] Pager [phone]  If 7PM-7AM, please contact night-coverage [email or web address] Password [facility] 06/21/2016, 2:44 PM

## 2016-06-21 NOTE — Discharge Instructions (Signed)
Gastrointestinal Bleeding °Gastrointestinal (GI) bleeding is bleeding somewhere along the digestive tract, between the mouth and anus. This can be caused by various problems. The severity of these problems can range from mild to serious or even life-threatening. If you have GI bleeding, you may find blood in your stools (feces), you may have black stools, or you may vomit blood. If there is a lot of bleeding, you may need to stay in the hospital. °What are the causes? °This condition may be caused by: °· Esophagitis. This is inflammation, irritation, or swelling of the esophagus. °· Hemorrhoids. These are swollen veins in the rectum. °· Anal fissures. These are areas of painful tearing that are often caused by passing hard stool. °· Diverticulosis. These are pouches that form on the colon over time, with age, and may bleed a lot. °· Diverticulitis. This is inflammation in areas with diverticulosis. It can cause pain, fever, and bloody stools, although bleeding may be mild. °· Polyps and cancer. Colon cancer often starts out as precancerous polyps. °· Gastritis and ulcers. With these, bleeding may come from the upper GI tract, near the stomach. °What are the signs or symptoms? °Symptoms of this condition may include: °· Bright red blood in your vomit, or vomit that looks like coffee grounds. °· Bloody, black, or tarry stools. °¨ Bleeding from the lower GI tract will usually cause red or maroon blood in the stools. °¨ Bleeding from the upper GI tract may cause black, tarry, often bad-smelling stools. °¨ In certain cases, if the bleeding is fast enough, the stools may be red. °· Pain or cramping in the abdomen. °How is this diagnosed? °This condition may be diagnosed based on: °· Medical history and physical exam. °· Various tests, such as: °¨ Blood tests. °¨ X-rays and other imaging tests. °¨ Esophagogastroduodenoscopy (EGD). In this test, a flexible, lighted tube is used to look at your esophagus, stomach, and small  intestine. °¨ Colonoscopy. In this test, a flexible, lighted tube is used to look at your colon. °How is this treated? °Treatment for this condition depends on the cause of the bleeding. For example: °· For bleeding from the esophagus, stomach, small intestine, or colon, the health care provider doing your EGD or colonoscopy may be able to stop the bleeding as part of the procedure. °· Inflammation or infection of the colon can be treated with medicines. °· Certain rectal problems can be treated with creams, suppositories, or warm baths. °· Surgery is sometimes needed. °· Blood transfusions are sometimes needed if a lot of blood has been lost. °If bleeding is slow, you may be allowed to go home. If there is a lot of bleeding, you will need to stay in the hospital for observation. °Follow these instructions at home: °· Take over-the-counter and prescription medicines only as told by your health care provider. °· Eat foods that are high in fiber. This will help to keep your stools soft. These foods include whole grains, legumes, fruits, and vegetables. Eating 1-3 prunes each day works well for many people. °· Drink enough fluid to keep your urine clear or pale yellow. °· Keep all follow-up visits as told by your health care provider. This is important. °Contact a health care provider if: °· Your symptoms do not improve. °Get help right away if: °· Your bleeding increases. °· You feel light-headed or you faint. °· You feel weak. °· You have severe cramps in your back or abdomen. °· You pass large blood clots in your stool. °· Your symptoms are   getting worse. °This information is not intended to replace advice given to you by your health care provider. Make sure you discuss any questions you have with your health care provider. °Document Released: 07/07/2000 Document Revised: 12/08/2015 Document Reviewed: 12/28/2014 °Elsevier Interactive Patient Education © 2017 [employer] ° °

## 2016-06-22 ENCOUNTER — Telehealth: Payer: Self-pay

## 2016-06-22 NOTE — Telephone Encounter (Signed)
There's currently no availability today or tomorrow. Will watch schedule for cancellations and call pt back to work-in.

## 2016-06-22 NOTE — Telephone Encounter (Signed)
Called pt and offered work-in appt this morning due to last minute no-shows. Says that she was recently in the hospital and is still not feeling her best. Also does not have a ride at this time as her sister-in-law and nephew are working. She will have her sister-in-law call back later today to see if we can work her in this afternoon.

## 2016-06-22 NOTE — Telephone Encounter (Signed)
Receive a call from pt's sister-in-law. Pt has been in the hospital recently and was discharged yesterday. Pt has been "aggravated" by her head and wants to know if Dr. Jannifer Franklin could see her today or tomorrow for "the shots in her head." Pt's sister-in-law says that Dr. Jannifer Franklin always works her in the last of the day whenever she wants.  I advised pt's wife that I would send this request to Dr. Tobey Grim nurse and she would call pt back.

## 2016-06-22 NOTE — Telephone Encounter (Signed)
Called back and spoke to pt's nephew. Says that pt seems to be doing ok right now as far as her headaches are concerned and she does not feel up to coming in for an appt this week. He agreed to call back when work-in appt is needed.

## 2016-06-29 ENCOUNTER — Ambulatory Visit (INDEPENDENT_AMBULATORY_CARE_PROVIDER_SITE_OTHER): Payer: Medicare Other | Admitting: Cardiology

## 2016-06-29 ENCOUNTER — Encounter: Payer: Self-pay | Admitting: Cardiology

## 2016-06-29 ENCOUNTER — Other Ambulatory Visit: Payer: Self-pay

## 2016-06-29 VITALS — BP 151/82 | HR 73 | Ht <= 58 in | Wt 81.0 lb

## 2016-06-29 DIAGNOSIS — I10 Essential (primary) hypertension: Secondary | ICD-10-CM

## 2016-06-29 DIAGNOSIS — K922 Gastrointestinal hemorrhage, unspecified: Secondary | ICD-10-CM | POA: Diagnosis not present

## 2016-06-29 DIAGNOSIS — D649 Anemia, unspecified: Secondary | ICD-10-CM | POA: Diagnosis not present

## 2016-06-29 DIAGNOSIS — I480 Paroxysmal atrial fibrillation: Secondary | ICD-10-CM | POA: Diagnosis not present

## 2016-06-29 DIAGNOSIS — D62 Acute posthemorrhagic anemia: Secondary | ICD-10-CM

## 2016-06-29 DIAGNOSIS — I519 Heart disease, unspecified: Secondary | ICD-10-CM

## 2016-06-29 LAB — CBC
HCT: 35.3 % (ref 35.0–45.0)
Hemoglobin: 11.7 g/dL (ref 11.7–15.5)
MCH: 29.8 pg (ref 27.0–33.0)
MCHC: 33.1 g/dL (ref 32.0–36.0)
MCV: 90.1 fL (ref 80.0–100.0)
MPV: 8.1 fL (ref 7.5–12.5)
Platelets: 320 10*3/uL (ref 140–400)
RBC: 3.92 MIL/uL (ref 3.80–5.10)
RDW: 16.4 % — ABNORMAL HIGH (ref 11.0–15.0)
WBC: 5.8 10*3/uL (ref 3.8–10.8)

## 2016-06-29 NOTE — Assessment & Plan Note (Signed)
Remote history of PAF without recurrence, on ASA only

## 2016-06-29 NOTE — Progress Notes (Signed)
06/29/2016 Alicia Gomez   10/16/1925  [id number]  Primary Physician Jani Gravel, MD Primary Cardiologist: Dr Martinique  HPI:  Delightful 82 y/o female followed by Dr Martinique with a history of DCM and remote PAF without recurrence. Her EF in '08 was 20-25%, this improved with medical Rx. She had remote PAF but has maintained NSR on beta blocker and ASA. She was admitted 11/26-11/29/17 with a lower GI bleed which required transfusion and interventional radiology intervention with coiling of superior rectal artery. An echo done then showed her EF to be 55-60% with grade 2 DD and moderate pulmonary HTN. Her rhythm remained stable. She is in the office today for follow up (we did not see her during this hospitalization and we are the first provider she has seen since discharge). She remains stable, no unusual dyspnea, no bleeding.    Current Outpatient Prescriptions  Medication Sig Dispense Refill  . acetaminophen (TYLENOL 8 HOUR) 650 MG CR tablet Take 1 tablet (650 mg total) by mouth every 8 (eight) hours as needed for pain. 30 tablet 0  . bimatoprost (LUMIGAN) 0.03 % ophthalmic drops Place 1 drop into both eyes at bedtime.     . carvedilol (COREG) 25 MG tablet Take 12.5 mg by mouth 2 (two) times daily with a meal.      . Cholecalciferol (VITAMIN D PO) Take 1 tablet by mouth 2 (two) times daily.     . Cinnamon 500 MG TABS Take 1 tablet by mouth 1 day or 1 dose.    . gabapentin (NEURONTIN) 100 MG capsule Take 100 mg by mouth 2 (two) times daily.    . meclizine (ANTIVERT) 25 MG tablet Take 25 mg by mouth 2 (two) times daily.     . timolol (BETIMOL) 0.5 % ophthalmic solution Place 1 drop into both eyes 2 (two) times daily.      No current facility-administered medications for this visit.     Allergies  Allergen Reactions  . Codeine Nausea And Vomiting    Social History   Social History  . Marital status: Widowed    Spouse name: N/A  . Number of children: 0  . Years of education: N/A    Occupational History  . wholesale jewelry     retired   Social History Main Topics  . Smoking status: Never Smoker  . Smokeless tobacco: Never Used  . Alcohol use No  . Drug use: No  . Sexual activity: Not on file   Other Topics Concern  . Not on file   Social History Narrative   Lives at home alone   Right-handed   Usually drinks decaf coffee in the morning and occasional Coke        Review of Systems: General: negative for chills, fever, night sweats or weight changes.  Cardiovascular: negative for chest pain, dyspnea on exertion, edema, orthopnea, palpitations, paroxysmal nocturnal dyspnea or shortness of breath Dermatological: negative for rash Respiratory: negative for cough or wheezing Urologic: negative for hematuria Abdominal: negative for nausea, vomiting, diarrhea, bright red blood per rectum, melena, or hematemesis Neurologic: negative for visual changes, syncope, or dizziness All other systems reviewed and are otherwise negative except as noted above.    Blood pressure (!) 151/82, pulse 73, height 4\' 2"  (1.27 m), weight 81 lb (36.7 kg), SpO2 97 %.  General appearance: alert, cooperative, appears stated age and no distress Lungs: clear to auscultation bilaterally and severe kyphosis Heart: regular rate and rhythm Extremities: no edema Skin: Skin  color, texture, turgor normal. No rashes or lesions Neurologic: Grossly normal  EKG [id number]  ASSESSMENT AND PLAN:   Acute blood loss anemia Transfused Nov 2017 2 units  Atrial fibrillation ([place]) Remote history of PAF without recurrence, on ASA only  Left ventricular dysfunction DCM 2008- EF improved to 55-60% by echo Nov 2017  Lower GI bleed Lower GI bleed treated with superior rectal artery embolization by interventional radiology Nov 2017   PLAN  Aspirin was stopped during recent admission. I ordered a CBC today.  GI recommended she follow up with her PCP and I encouraged them to arrange an appointment  with Dr Maudie Mercury.  Resume ASA 81 daily mg pending her CBC. F/U with dr Martinique in one year.  Kerin Ransom PA-C 06/29/2016 10:27 AM

## 2016-06-29 NOTE — Assessment & Plan Note (Signed)
DCM 2008- EF improved to 55-60% by echo Nov 2017

## 2016-06-29 NOTE — Patient Instructions (Addendum)
Medication Instructions: Your physician recommends that you continue on your current medications as directed. Please refer to the Current Medication list given to you today.  Labwork:  Your physician recommends that you return for lab work today: CBC    Follow-Up: Your physician wants you to follow-up in: 1 year with Dr. Martinique. You will receive a reminder letter in the mail two months in advance. If you don't receive a letter, please call our office to schedule the follow-up appointment.  If you need a refill on your cardiac medications before your next appointment, please call your pharmacy.

## 2016-06-29 NOTE — Assessment & Plan Note (Signed)
Transfused Nov 2017 2 units

## 2016-06-29 NOTE — Assessment & Plan Note (Addendum)
Lower GI bleed treated with superior rectal artery embolization by interventional radiology Nov 2017

## 2016-06-29 NOTE — Patient Outreach (Signed)
[facility]) Care Management  06/29/2016  ANALUISA BURKER Jul 12, 1926 [id number]  E[facility]i Heart Failure Program   Referral Date:  06/29/16 [facility]i Heart Failure Program New / worsening problems? Yes Insurance: [employer] Eligibility: yes RAF Score: 0.972  Subjective:   [place] [id number] Outreach call #1 to patient.  States she competed post hospital appointments  with both MD's.    Providers: PCP:  Dr. Jani Gravel  Scheduled 06/30/16 Cardiologist:  Dr. Peter Martinique  /  Kerin Ransom, PA-C [place], 06/29/16 (completed) Neurologist:  Dr. Margette Fast  Psycho/Social: Patient lives in her home but temporarily staying with nephew and his wife.   Widowed since 2002.  Mobility: walker with 2 wheels front and 2 tennis balls back  Falls: none  Pain: none.  Tylenol for headaches when needed.  Depression: none  Transportation: family patient does not drive.  Caregiver: nephew Emergency Contact:  Advance Directive: nephew  Consent:  yes DME: cane,  walker with 2 wheels front and 2 tennis balls back, scales, eyeglasses, dentures (upper and lower)   Co-morbidities:  H/o  Atrial fibrillation, Hypertension, Dyslipidemia, Chronic systolic CHF (congestive heart failure), GI bleed, Symptomatic anemia, Gastrointestinal hemorrhage, Hyponatremia. Acute blood loss anemia, Lower GI bleed, diverticulosis, left occipital headaches managed with occipital nerve injections. (Dr. Margette Fast) H/o EF to be 55-60% with grade 2 DD and moderate pulmonary HTN.   TOC - Lower GI Bleed Admission:  06/18/2016 - 06/21/2016 - Lower GI Bleed.   H/o patient declined [place] services at discharge H/o Lower GI bleed treated with superior rectal artery embolization by interventional radiology Nov 2017 States appetite improving and starting to eat more.  Last bowel movement 06/28/16;  Formed and no noted signs of blood.   BP 100/45 06/18/2016 Weight 87 lb (39 kg)  06/18/2016  - (Patient states morning weight 81) Height 50 in (127 cm) 06/18/2016 BMI 24.50 (Normal) 06/18/2016  Lipid Panel completed 02/23/2016 HDL 76.000 02/23/2016 LDL 143.000 02/23/2016 Cholesterol, total 252.000 02/23/2016 Triglycerides 166.000 02/23/2016 A1C 5.600 07/15/2014 Glucose Random 93.000 06/21/2016  Heart Failure Patient states Cardiologist advised patient yesterday that patient had CHF years ago but no longer an issue.  Patient weighs daily.    Medications:  Patient less than 15 medications  Co-pay cost issues: none  Patient self manages meds and uses a Medication Box Prevnar (PCV13) N/D Pneumovax (PPS 08/07/2001 Flu Vaccine 04/17/2014 tDAP Vaccine N/D   Encounter Medications:  Outpatient Encounter Prescriptions as of 06/29/2016  Medication Sig  . acetaminophen (TYLENOL 8 HOUR) 650 MG CR tablet Take 1 tablet (650 mg total) by mouth every 8 (eight) hours as needed for pain.  . bimatoprost (LUMIGAN) 0.03 % ophthalmic drops Place 1 drop into both eyes at bedtime.   . carvedilol (COREG) 25 MG tablet Take 12.5 mg by mouth 2 (two) times daily with a meal.    . Cholecalciferol (VITAMIN D PO) Take 1 tablet by mouth 2 (two) times daily.   . Cinnamon 500 MG TABS Take 1 tablet by mouth 1 day or 1 dose.  . gabapentin (NEURONTIN) 100 MG capsule Take 100 mg by mouth 2 (two) times daily.  . meclizine (ANTIVERT) 25 MG tablet Take 25 mg by mouth 2 (two) times daily.   . timolol (BETIMOL) 0.5 % ophthalmic solution Place 1 drop into both eyes 2 (two) times daily.    No facility-administered encounter medications on file as of 06/29/2016.     Functional Status:  In your present state of health, do you have any difficulty performing the following activities: 06/29/2016 06/18/2016  Hearing? N N  Vision? N N  Difficulty concentrating or making decisions? N N  Walking or climbing stairs? Y Y  Dressing or bathing? N Y  Doing errands, shopping? Y N  Preparing Food and eating ? N -  Using  the Toilet? N -  In the past six months, have you accidently leaked urine? N -  Do you have problems with loss of bowel control? N -  Managing your Medications? N -  Managing your Finances? N -  Housekeeping or managing your Housekeeping? N -  Some recent data might be hidden    Fall/Depression Screening: PHQ 2/9 Scores 06/29/2016  PHQ - 2 Score 0    Fall Risk  06/29/2016  Falls in the past year? No    Preventives: Hearing: unknown  Eyes: yearly exams- next appt 07/2015 Dentist: none - dentures - no gum problems.   Plan:  Emmi HF Program 06/29/16 - 06/29/16 Screening, Initial Assessment, TOC:  06/29/16 [facility] Telephonic RN CM 06/29/16 Program:  Other - lower GI Bleed 06/29/16  Lower GI Bleed RN CM advised to report any signs or symptoms of bleeding to MD.   RN CM encouraged to assess bowel movements for blood and / or any blood on the tissue paper.  RN CM encouraged to discuss treatment plan of diverticulosis with PCP on 06/30/16 appt.    RN CM advised in next [facility] scheduled contact call within one week (TOC.  RN CM advised to please notify MD of any changes in condition prior to scheduled appt's.   RN CM provided contact name and # [phone] or main office # [phone] and 24-hour nurse line # [phone].  RN CM confirmed patient is aware of 911 services for urgent emergency needs.  RN CM sent successful outreach letter and  [facility] Introductory package. RN CM sent Physician Enrollment/Barriers Letter and Initial Assessment to Primary MD RN CM notified [place] Management Assistant: agreed to services/case opened.   THN CM Care Plan Problem One   Flowsheet Row Most Recent Value  Care Plan Problem One  Admission within the past 30 days: lower GI Bleed  Role Documenting the Problem One  Care Management Telephonic [place] for Problem One  Active  THN Long Term Goal (31-90 days)  Patient will have no admissions over the next 31-60 days.   THN Long Term Goal  Start Date  06/29/16  Interventions for Problem One Long Term Goal  RN CM will encourage patient to report any change in condition to MD to avoid unnecessary admissions over the next 31-90 days.   THN CM Short Term Goal #1 (0-30 days)  Patient will practice self mangement interventions to observe for signs / sympoms of bleeding over the next 30 days.   THN CM Short Term Goal #1 Start Date  06/29/16  Interventions for Short Term Goal #1  RN CM will provide education on signs and symptoms of GI Bleed over the next 31-90 days.     [facility] CM Care Plan Problem Two   Flowsheet Row Most Recent Value  Care Plan Problem Two  Knowledge deficit associated to management of Diverticulosis.   Role Documenting the Problem Two  Care Management Telephonic Coordinator  Care Plan for Problem Two  Active  THN CM Short Term Goal #1 (0-30 days)  Patient will improve knowledge of diverticulosis and risk of GI bleed  over the next 30 days.   THN CM Short Term Goal #1 Start Date  06/29/16  Interventions for Short Term Goal #2   RN CM will assess, monitor and teach disease management for Diverticulosis and prevention of GI Bleed over the next 30 days.     [facility] CM Care Plan Problem Three   Flowsheet Row Most Recent Value  Care Plan Problem Three  Safety:  Patient declined [place] services at discharge and currently staying with family for care.  Role Documenting the Problem Three  Care Management Telephonic Coordinator  Care Plan for Problem Three  Active  THN CM Short Term Goal #1 (0-30 days)  Patient will remain with caregivers/family over the next 30 days.   THN CM Short Term Goal #1 Start Date  06/29/16  Interventions for Short Term Goal #1  RN CM will assess, monitor and teach level of care needs during  post hospital recovery and assist as needed with any special coordination of services over the next 30 days.       Nathaneil Canary, BSN, RN, [place] Management Care Management  Coordinator [phone] Direct [phone] Cell [phone] Office [phone] Fax [email or web address]

## 2016-06-30 DIAGNOSIS — R42 Dizziness and giddiness: Secondary | ICD-10-CM | POA: Diagnosis not present

## 2016-06-30 DIAGNOSIS — Z09 Encounter for follow-up examination after completed treatment for conditions other than malignant neoplasm: Secondary | ICD-10-CM | POA: Diagnosis not present

## 2016-06-30 DIAGNOSIS — I4891 Unspecified atrial fibrillation: Secondary | ICD-10-CM | POA: Diagnosis not present

## 2016-06-30 DIAGNOSIS — E559 Vitamin D deficiency, unspecified: Secondary | ICD-10-CM | POA: Diagnosis not present

## 2016-07-03 ENCOUNTER — Telehealth: Payer: Self-pay | Admitting: Cardiology

## 2016-07-03 ENCOUNTER — Other Ambulatory Visit: Payer: Self-pay

## 2016-07-03 NOTE — Telephone Encounter (Signed)
See recent CBC result note.

## 2016-07-03 NOTE — Telephone Encounter (Signed)
This encounter was created in error - please disregard.  This encounter was created in error - please disregard.

## 2016-07-03 NOTE — Patient Outreach (Signed)
[facility]) Care Management  07/03/2016  Alicia Gomez 1926/05/22 [id number]   Telephonic [facility]i HF Red Alert:  New/Worsening problems? Yes  Outreach call to patient.  Patient states she is feeling about the same and does not feel worse.   States she is doing just fine and continues to make progress.  States appetite improving, eating more and having regular bowel movements.  Denies any needs and appreciates call.    Referral Date:  06/29/16 Emmi Heart Failure Program New / worsening problems? Yes Insurance: [employer] Eligibility: yes RAF Score: 0.972  Subjective:   [place] [address] Outreach call #1 to patient.  States she competed post hospital appointments  with both MD's.    Providers: PCP:  Dr. Jani Gravel  Scheduled 06/30/16 Cardiologist:  Dr. Peter Martinique  /  Kerin Ransom, PA-C [place], 06/29/16 (completed) Neurologist:  Dr. Margette Fast  Psycho/Social: Patient lives in her home but temporarily staying with nephew and his wife.   Widowed since 2002.  Mobility: walker with 2 wheels front and 2 tennis balls back  Falls: none  Pain: none.  Tylenol for headaches when needed.  Depression: none  Transportation: family patient does not drive.  Caregiver: nephew Emergency Contact:  Advance Directive: nephew  Consent:  yes DME: cane,  walker with 2 wheels front and 2 tennis balls back, scales, eyeglasses, dentures (upper and lower)   Co-morbidities:  H/o  Atrial fibrillation, Hypertension, Dyslipidemia, Chronic systolic CHF (congestive heart failure), GI bleed, Symptomatic anemia, Gastrointestinal hemorrhage, Hyponatremia. Acute blood loss anemia, Lower GI bleed, diverticulosis, left occipital headaches managed with occipital nerve injections. (Dr. Margette Fast) H/o EF to be 55-60% with grade 2 DD and moderate pulmonary HTN.   TOC - Lower GI Bleed Admission:  06/18/2016 - 06/21/2016 - Lower GI Bleed.   H/o  patient declined [place] services at discharge H/o Lower GI bleed treated with superior rectal artery embolization by interventional radiology Nov 2017 Appetite improving and eating better.   BP 100/45 06/18/2016 Weight 87 lb (39 kg) 06/18/2016  - (Patient states morning weight 81) Height 50 in (127 cm) 06/18/2016 BMI 24.50 (Normal) 06/18/2016  Lipid Panel completed 02/23/2016 HDL 76.000 02/23/2016 LDL 143.000 02/23/2016 Cholesterol, total 252.000 02/23/2016 Triglycerides 166.000 02/23/2016 A1C 5.600 07/15/2014 Glucose Random 93.000 06/21/2016  Heart Failure Patient states Cardiologist advised patient on last visit 06/2016 that patient had CHF years ago but no longer an issue. Patient weighs daily.    Medications:  Patient less than 15 medications  Co-pay cost issues: none  Patient self manages meds and uses a Medication Box Prevnar (PCV13) N/D Pneumovax (PPS 08/07/2001 Flu Vaccine 04/17/2014 tDAP Vaccine N/D   Encounter Medications:  Outpatient Encounter Prescriptions as of 07/03/2016  Medication Sig  . acetaminophen (TYLENOL 8 HOUR) 650 MG CR tablet Take 1 tablet (650 mg total) by mouth every 8 (eight) hours as needed for pain.  . bimatoprost (LUMIGAN) 0.03 % ophthalmic drops Place 1 drop into both eyes at bedtime.   . carvedilol (COREG) 25 MG tablet Take 12.5 mg by mouth 2 (two) times daily with a meal.    . Cholecalciferol (VITAMIN D PO) Take 1 tablet by mouth 2 (two) times daily.   . Cinnamon 500 MG TABS Take 1 tablet by mouth 1 day or 1 dose.  . gabapentin (NEURONTIN) 100 MG capsule Take 100 mg by mouth 2 (two) times daily.  . meclizine (ANTIVERT) 25 MG tablet Take 25 mg  by mouth 2 (two) times daily.   . timolol (BETIMOL) 0.5 % ophthalmic solution Place 1 drop into both eyes 2 (two) times daily.    No facility-administered encounter medications on file as of 07/03/2016.     Functional Status:  In your present state of health, do you have any difficulty performing the following  activities: 06/29/2016 06/18/2016  Hearing? N N  Vision? N N  Difficulty concentrating or making decisions? N N  Walking or climbing stairs? Y Y  Dressing or bathing? N Y  Doing errands, shopping? Y N  Preparing Food and eating ? N -  Using the Toilet? N -  In the past six months, have you accidently leaked urine? N -  Do you have problems with loss of bowel control? N -  Managing your Medications? N -  Managing your Finances? N -  Housekeeping or managing your Housekeeping? N -  Some recent data might be hidden    Fall/Depression Screening: PHQ 2/9 Scores 06/29/2016  PHQ - 2 Score 0    Fall Risk  06/29/2016  Falls in the past year? No    Preventives: Hearing: unknown  Eyes: yearly exams- next appt 07/2015 Dentist: none - dentures - no gum problems.   Plan:  [facility] HF Program 06/29/16 - 06/29/16 Screening, Initial Assessment, TOC:  06/29/16 [facility] Telephonic RN CM 06/29/16 Program:  TOC Other - lower GI Bleed 06/29/16  Lower GI Bleed RN CM advised to report any signs or symptoms of bleeding to MD.   RN CM encouraged to assess bowel movements for blood and / or any blood on the tissue paper.  RN CM encouraged to discuss treatment plan of diverticulosis with PCP on 06/30/16 appt.    RN CM advised in next [facility] scheduled contact call within one week (TOC.  RN CM advised to please notify MD of any changes in condition prior to scheduled appt's.   RN CM provided contact name and # [phone] or main office # [phone] and 24-hour nurse line # [phone].  RN CM confirmed patient is aware of 911 services for urgent emergency needs.  Nathaneil Canary, BSN, RN, [place] Management Care Management Coordinator [phone] Direct [phone] Cell [phone] Office [phone] Fax [email or web address]

## 2016-07-03 NOTE — Patient Outreach (Signed)
Patient triggered Red on EMMI heart failure dashboard. Notification sent to Mariann Laster, RN.

## 2016-07-03 NOTE — Telephone Encounter (Signed)
Pt's sister asking to call her regarding pt's labs, pt has has a hard time understanding and probably won't call you back-sister Romie Minus [phone]

## 2016-07-10 ENCOUNTER — Ambulatory Visit: Payer: Self-pay

## 2016-07-11 ENCOUNTER — Ambulatory Visit: Payer: Self-pay

## 2016-07-12 ENCOUNTER — Ambulatory Visit: Payer: Self-pay

## 2016-07-13 ENCOUNTER — Ambulatory Visit: Payer: Self-pay

## 2016-07-13 DIAGNOSIS — H401123 Primary open-angle glaucoma, left eye, severe stage: Secondary | ICD-10-CM | POA: Diagnosis not present

## 2016-07-13 DIAGNOSIS — H401113 Primary open-angle glaucoma, right eye, severe stage: Secondary | ICD-10-CM | POA: Diagnosis not present

## 2016-07-14 ENCOUNTER — Ambulatory Visit: Payer: Self-pay

## 2016-07-19 ENCOUNTER — Other Ambulatory Visit: Payer: Self-pay

## 2016-07-19 NOTE — Patient Outreach (Signed)
[facility]) Care Management  07/19/2016  Alicia Gomez [birth year] [id number]    Referral Date:  06/29/16 Alicia Gomez 06/29/16 - 06/29/16 Screening, Initial Assessment, TOC:  06/29/16 [facility] Telephonic RN CM 06/29/16 - 07/19/16 Gomez:  Alicia Gomez 06/29/16 - 07/19/16 Insurance: [place] [employer]  Subjective:   [place] [address] [place] [id number]   Providers: PCP:  Dr. Jani Gravel  Scheduled 06/30/16 Cardiologist:  Dr. Peter Martinique  /  Kerin Ransom, PA-C [place], 06/29/16 (completed) Neurologist:  Dr. Margette Fast  Psycho/Social: Patient lives in her home but temporarily staying with nephew and his wife.   Widowed since 2002.  Mobility: walker with 2 wheels front and 2 tennis balls back  Falls: none  Pain: none.  Tylenol for headaches when needed.  Depression: none  Transportation: family patient does not drive.  Caregiver: nephew Emergency Contact:  Advance Directive: nephew  Consent:  yes DME: cane,  walker with 2 wheels front and 2 tennis balls back, scales, eyeglasses, dentures (upper and lower)   Co-morbidities:  H/o  Atrial fibrillation, Hypertension, Dyslipidemia, Chronic systolic CHF (congestive heart failure), GI bleed, Symptomatic anemia, Gastrointestinal hemorrhage, Hyponatremia. Acute blood loss anemia, Lower GI bleed, diverticulosis, left occipital headaches managed with occipital nerve injections. (Dr. Margette Fast) H/o EF to be 55-60% with grade 2 DD and moderate pulmonary HTN.   TOC - Lower GI Bleed Admission:  06/18/2016 - 06/21/2016 - Lower GI Bleed.   H/o patient declined [place] services at discharge H/o Lower GI bleed treated with superior rectal artery embolization by interventional radiology Nov 2017 Appetite improved.   BP 100/45 06/18/2016 Weight 87 lb (39 kg) 06/18/2016  - (Patient states morning weight 82)  Height 50 in (127 cm) 06/18/2016 BMI 24.50 (Normal) 06/18/2016  Lipid Panel completed 02/23/2016 HDL 76.000  02/23/2016 LDL 143.000 02/23/2016 Cholesterol, total 252.000 02/23/2016 Triglycerides 166.000 02/23/2016 A1C 5.600 07/15/2014 Glucose Random 93.000 06/21/2016  Heart Failure Patient states Cardiologist advised patient on last visit 06/2016 that patient had CHF years ago but no longer an issue. Patient weighs daily.    Medications:  Patient less than 15 medications  Co-pay cost issues: none  Patient self manages meds and uses a Medication Box Prevnar (PCV13) N/D Pneumovax (PPS 08/07/2001 Flu Vaccine 04/17/2014 tDAP Vaccine N/D   Encounter Medications:  Outpatient Encounter Prescriptions as of 07/19/2016  Medication Sig  . acetaminophen (TYLENOL 8 HOUR) 650 MG CR tablet Take 1 tablet (650 mg total) by mouth every 8 (eight) hours as needed for pain.  . bimatoprost (LUMIGAN) 0.03 % ophthalmic drops Place 1 drop into both eyes at bedtime.   . carvedilol (COREG) 25 MG tablet Take 12.5 mg by mouth 2 (two) times daily with a meal.    . Cholecalciferol (VITAMIN D PO) Take 1 tablet by mouth 2 (two) times daily.   . Cinnamon 500 MG TABS Take 1 tablet by mouth 1 day or 1 dose.  . gabapentin (NEURONTIN) 100 MG capsule Take 100 mg by mouth 2 (two) times daily.  . meclizine (ANTIVERT) 25 MG tablet Take 25 mg by mouth 2 (two) times daily.   . timolol (BETIMOL) 0.5 % ophthalmic solution Place 1 drop into both eyes 2 (two) times daily.    No facility-administered encounter medications on file as of 07/19/2016.     Functional Status:  In your present state of health, do you have any difficulty performing the following activities: 06/29/2016 06/18/2016  Hearing? N N  Vision? N N  Difficulty concentrating or making decisions? N N  Walking or climbing stairs? Y Y  Dressing or bathing? N Y  Doing errands, shopping? Y N  Preparing Food and eating ? N -  Using the Toilet? N -  In the past six months, have you accidently leaked urine? N -  Do you have problems with loss of bowel control? N -  Managing your  Medications? N -  Managing your Finances? N -  Housekeeping or managing your Housekeeping? N -  Some recent data might be hidden    Fall/Depression Screening: PHQ 2/9 Scores 06/29/2016  PHQ - 2 Score 0    Fall Risk  06/29/2016  Falls in the past year? No    Preventives: Hearing: unknown  Eyes: yearly exams- next appt 07/2015 Dentist: none - dentures - no gum problems.   Plan:  Alicia Gomez 06/29/16 - 06/29/16 Screening, Initial Assessment, TOC:  06/29/16 [facility] Telephonic RN CM 06/29/16 Gomez:  TOC - 06/29/16 - 07/19/16  Lower GI Bleed RN CM advised to report any signs or symptoms of bleeding to MD.   RN CM encouraged to assess bowel movements for blood and / or any blood on the tissue paper.   RN CM advised in case closure:  Goals of care met.   [facility] and MD notified.  RN CM advised to please notify MD of any changes in condition prior to scheduled appt's.   RN CM provided contact name and # [phone] or main office # [phone] and 24-hour nurse line # [phone].  RN CM confirmed patient is aware of 911 services for urgent emergency needs.  Nathaneil Canary, BSN, RN, [facility] Care Management Coordinator [phone] Direct [phone] Cell [phone] Office [phone] Fax [email or web address]

## 2016-08-02 ENCOUNTER — Other Ambulatory Visit: Payer: Self-pay

## 2016-08-02 NOTE — Patient Outreach (Addendum)
[facility]) Care Management  08/02/2016  Alicia Gomez Sep 15, 1925 [id number]  [facility] Heart Failure Program   Referral Date:  08/02/2016 Source:  E[facility]i Heart Failure Red Alert Issue:  Day # 36 Emmi Program:  New / worsening problems? Yes  New / worsening shortness of breath? Yes  Insurance: Teacher, English as a foreign language  Subjective:   [address] [place] [address] Outreach call #1 to patient.  Patient completed call.    Providers: PCP:  Dr. Jani Gravel  Scheduled 06/30/16 - next appt - labs 08/22/16 and MD appt 08/28/16 Cardiologist:  Dr. Peter Martinique  /  Kerin Ransom, PA-C [place], 06/29/16 (completed) Neurologist:  Dr. Margette Fast  Psycho/Social: Patient is widowed since 2002 and  lives in her home and nephew calls patient to check on her, provide shopping needs and takes patient to his home for care as needed.   Mobility: walker with 2 wheels front and 2 tennis balls back  Falls: none  Pain: none.  Depression: none  Transportation: family; patient does not drive.  Caregiver and Emergency contact: nephew Advance Directive: nephew  DME: cane,  walker with 2 wheels front and 2 tennis balls back, scales, eyeglasses, dentures (upper and lower)   Co-morbidities:  H/o  Atrial fibrillation, Hypertension, Dyslipidemia, Chronic systolic CHF (congestive heart failure), GI bleed, Symptomatic anemia, Gastrointestinal hemorrhage, Hyponatremia. Acute blood loss anemia, Lower GI bleed, diverticulosis, left occipital headaches managed with occipital nerve injections. (Dr. Margette Fast) H/o EF to be 55-60% with grade 2 DD and moderate pulmonary HTN.  Admissions:  06/18/2016 - 06/21/2016 - Lower GI Bleed.     CHF: Home morning weight 83 lbs.  Patient states Cardiologist advised patient that patient had CHF years ago but no longer an issue.  Diet:  Patient eating soup.   Medications:  Patient less than 15 medications  Co-pay cost issues: none  Patient self manages  meds and uses a Medication Box Prevnar (PCV13) N/D Pneumovax (PPS 08/07/2001 Flu Vaccine 04/17/2014 tDAP Vaccine N/D   Encounter Medications:  Outpatient Encounter Prescriptions as of 08/02/2016  Medication Sig  . acetaminophen (TYLENOL 8 HOUR) 650 MG CR tablet Take 1 tablet (650 mg total) by mouth every 8 (eight) hours as needed for pain.  . bimatoprost (LUMIGAN) 0.03 % ophthalmic drops Place 1 drop into both eyes at bedtime.   . carvedilol (COREG) 25 MG tablet Take 12.5 mg by mouth 2 (two) times daily with a meal.    . Cholecalciferol (VITAMIN D PO) Take 1 tablet by mouth 2 (two) times daily.   . Cinnamon 500 MG TABS Take 1 tablet by mouth 1 day or 1 dose.  . gabapentin (NEURONTIN) 100 MG capsule Take 100 mg by mouth 2 (two) times daily.  . meclizine (ANTIVERT) 25 MG tablet Take 25 mg by mouth 2 (two) times daily.   . timolol (BETIMOL) 0.5 % ophthalmic solution Place 1 drop into both eyes 2 (two) times daily.    No facility-administered encounter medications on file as of 08/02/2016.     Functional Status:  In your present state of health, do you have any difficulty performing the following activities: 06/29/2016 06/18/2016  Hearing? N N  Vision? N N  Difficulty concentrating or making decisions? N N  Walking or climbing stairs? Y Y  Dressing or bathing? N Y  Doing errands, shopping? Y N  Preparing Food and eating ? N -  Using the Toilet? N -  In the past six months,  have you accidently leaked urine? N -  Do you have problems with loss of bowel control? N -  Managing your Medications? N -  Managing your Finances? N -  Housekeeping or managing your Housekeeping? N -  Some recent data might be hidden   Fall Risk  08/02/2016 06/29/2016  Falls in the past year? No No   Preventives: Hearing: unknown  Eyes: yearly exams- next appt 07/2015 Dentist: none - dentures - no gum problems.   Plan:  Emmi HF Program  Consult, Screening, Initial Assessment 08/02/16 [facility] Heart Failure  08/02/16 - 08/02/16   Emmi - CHF Patient states Cardiologist advised patient that patient had CHF years ago but no longer an issue.  RN CM provided education on low sodium diet and buying low sodium products.  RN CM encouraged to read food labels for content.  No further needs identified.   RN CM advised case closed; goals of care met.  RN CM advised to please notify MD of any changes in condition prior to scheduled appt's.   RN CM provided contact name and # [phone] or main office # [phone] and 24-hour nurse line # [phone].  RN CM confirmed patient is aware of 911 services for urgent emergency needs.  [facility] notified Case Closure letters to patient and MD.  [facility] CM Care Plan Problem One   Flowsheet Row Most Recent Value  Care Plan Problem One  No Needs identified.       Nathaneil Canary, BSN, RN, [place] Care Management Care Management Coordinator [phone] Direct [phone] Cell [phone] Office [phone] Fax [email or web address]

## 2016-08-22 DIAGNOSIS — E785 Hyperlipidemia, unspecified: Secondary | ICD-10-CM | POA: Diagnosis not present

## 2016-08-22 DIAGNOSIS — I10 Essential (primary) hypertension: Secondary | ICD-10-CM | POA: Diagnosis not present

## 2016-08-28 DIAGNOSIS — I10 Essential (primary) hypertension: Secondary | ICD-10-CM | POA: Diagnosis not present

## 2016-08-28 DIAGNOSIS — E559 Vitamin D deficiency, unspecified: Secondary | ICD-10-CM | POA: Diagnosis not present

## 2016-08-28 DIAGNOSIS — M818 Other osteoporosis without current pathological fracture: Secondary | ICD-10-CM | POA: Diagnosis not present

## 2016-08-28 DIAGNOSIS — I480 Paroxysmal atrial fibrillation: Secondary | ICD-10-CM | POA: Diagnosis not present

## 2016-10-12 DIAGNOSIS — H401133 Primary open-angle glaucoma, bilateral, severe stage: Secondary | ICD-10-CM | POA: Diagnosis not present

## 2016-10-12 DIAGNOSIS — H5203 Hypermetropia, bilateral: Secondary | ICD-10-CM | POA: Diagnosis not present

## 2017-01-18 DIAGNOSIS — H401133 Primary open-angle glaucoma, bilateral, severe stage: Secondary | ICD-10-CM | POA: Diagnosis not present

## 2017-02-07 IMAGING — US IR ANGIO/VISCERAL SELECTIVE EA VESSEL WO/W FLUSH
1 series · 1 of 1 positions shown · non-contrast
Comparison: none

INDICATION: 89-year-old female with new painless lower gastrointestinal
hemorrhage, positive nuclear medicine bleeding scan, and hemodynamic
instability.

[Series 1: ir rad eval and mgt. · 1 of 1 slices shown]
[im 1/1]
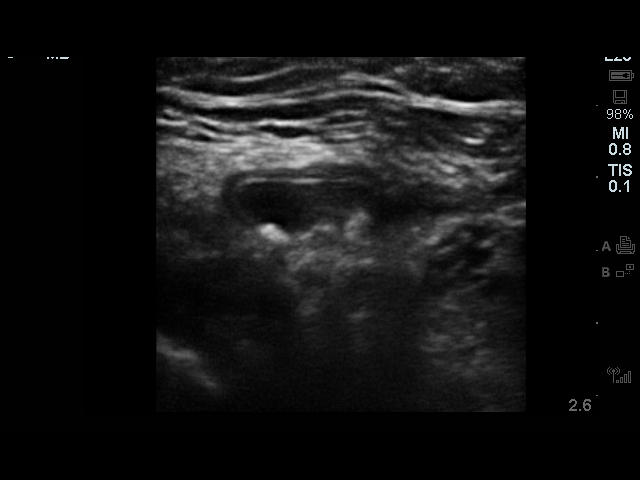

[1 of 1 positions shown; findings below may reference images not displayed]

She has been referred for evaluation of angiogram and possible
therapy.

EXAM:
SELECTIVE VISCERAL ARTERIOGRAPHY; IR ULTRASOUND GUIDANCE VASC ACCESS
RIGHT; IR INUTSUKA TIEKO HEMORR LYMPH EXTRAV INC GUIDE ROADMAPPING;
ARTERIOGRAPHY; ADDITIONAL ARTERIOGRAPHY

MEDICATIONS:
None

ANESTHESIA/SEDATION:
Moderate (conscious) sedation was employed during this procedure. A
total of Versed 1.5 mg and Fentanyl 62.5 mcg was administered
intravenously.

Moderate Sedation Time: 90 minutes. The patient's level of
consciousness and vital signs were monitored continuously by
radiology nursing throughout the procedure under my direct
supervision.

CONTRAST:  270 cc

FLUOROSCOPY TIME:  Fluoroscopy Time: 27 minutes 36 seconds (74.9
mGy).

COMPLICATIONS:
None immediate.



Ultrasound survey of the right inguinal region was performed with
images stored and sent to PACs.

A micropuncture needle was used access the right common femoral
artery under ultrasound. With excellent arterial blood flow
returned, and an .018 micro wire was passed through the needle,
observed enter the abdominal aorta under fluoroscopy. The needle was
removed, and a micropuncture sheath was placed over the wire. The
inner dilator and wire were removed, and an 035 Bentson wire was
advanced under fluoroscopy into the abdominal aorta. The sheath was
removed and a standard 5 French vascular sheath was placed. The
dilator was removed and the sheath was flushed.

Standard C2 Cobra catheter was advanced over the wire into the
abdomen. Cobra catheter was used to select the superior mesenteric
artery.

Mesenteric artery angiogram was performed.

Cobra catheter was then exchanged for an Omni Flush catheter.

Lower aortic angiogram and pelvic angiogram performed.

Variation of catheters used to select inferior mesenteric artery. CT
cobra catheter, Chung 2.5 catheter, and Roche inferior mesenteric
catheter were used. The rim catheter was successful in selecting the
origin of the inferior mesenteric artery.

IMA angiogram performed.

Micro catheter system was then advanced through the rim catheter.
105 cm micro catheter and a combination of a 14 Fathom and a synchro
soft were used for selective/sub selective angiogram of branches of
the superior rectal artery.

Once the catheter tip was confirmed to be within abnormal fourth
order vessel of the superior rectal branches, coil embolization was
performed with 2 mm x 2 cm and a 2 mm x 4 cm coil.

Final angiogram performed.

Angiogram of the right common femoral artery was performed.

Exoseal was deployed for hemostasis.
FINDINGS: Superior mesenteric artery angiogram demonstrates patent vasculature
with no tumor blush, extravasation of contrast, or contrast stasis.
Contribution through the middle colic artery to the gastroepiploic
artery.

Lower aortic angiogram demonstrates mild atherosclerotic changes
with no aneurysm or dissection. Moderate atherosclerotic changes of
the right common iliac artery with mild atherosclerotic changes of
the left common iliac artery. No asymmetric flow.

Bilateral L5 lumbar arteries are patent.

Bilateral hypogastric arteries patent. No significant
atherosclerotic changes of the proximal femoral vasculature.

IMA angiogram demonstrates venous shunting/contrast staining
associated with fourth order branches of the superior rectal artery.
Widely patent left colic artery with patent marginal artery of
Jim.

Status post coil embolization of abnormal fourth order branch with
no ongoing contrast staining or venous shunting.
IMPRESSION: Status post mesenteric artery angiogram and coil embolization of
abnormal fourth order branch of superior rectal artery for treatment
of acute lower GI hemorrhage.

Deployment of Exoseal for right common femoral artery hemostasis.

## 2017-03-01 DIAGNOSIS — E559 Vitamin D deficiency, unspecified: Secondary | ICD-10-CM | POA: Diagnosis not present

## 2017-03-01 DIAGNOSIS — E7800 Pure hypercholesterolemia, unspecified: Secondary | ICD-10-CM | POA: Diagnosis not present

## 2017-03-01 DIAGNOSIS — I10 Essential (primary) hypertension: Secondary | ICD-10-CM | POA: Diagnosis not present

## 2017-03-01 DIAGNOSIS — M818 Other osteoporosis without current pathological fracture: Secondary | ICD-10-CM | POA: Diagnosis not present

## 2017-03-01 DIAGNOSIS — R7989 Other specified abnormal findings of blood chemistry: Secondary | ICD-10-CM | POA: Diagnosis not present

## 2017-03-07 DIAGNOSIS — I10 Essential (primary) hypertension: Secondary | ICD-10-CM | POA: Diagnosis not present

## 2017-03-07 DIAGNOSIS — I4891 Unspecified atrial fibrillation: Secondary | ICD-10-CM | POA: Diagnosis not present

## 2017-03-07 DIAGNOSIS — E559 Vitamin D deficiency, unspecified: Secondary | ICD-10-CM | POA: Diagnosis not present

## 2017-03-07 DIAGNOSIS — E7800 Pure hypercholesterolemia, unspecified: Secondary | ICD-10-CM | POA: Diagnosis not present

## 2017-04-23 DIAGNOSIS — H401133 Primary open-angle glaucoma, bilateral, severe stage: Secondary | ICD-10-CM | POA: Diagnosis not present

## 2017-07-31 DIAGNOSIS — H401133 Primary open-angle glaucoma, bilateral, severe stage: Secondary | ICD-10-CM | POA: Diagnosis not present

## 2017-09-03 DIAGNOSIS — I4891 Unspecified atrial fibrillation: Secondary | ICD-10-CM | POA: Diagnosis not present

## 2017-09-03 DIAGNOSIS — E559 Vitamin D deficiency, unspecified: Secondary | ICD-10-CM | POA: Diagnosis not present

## 2017-09-03 DIAGNOSIS — N390 Urinary tract infection, site not specified: Secondary | ICD-10-CM | POA: Diagnosis not present

## 2017-09-03 DIAGNOSIS — E7800 Pure hypercholesterolemia, unspecified: Secondary | ICD-10-CM | POA: Diagnosis not present

## 2017-09-03 DIAGNOSIS — I10 Essential (primary) hypertension: Secondary | ICD-10-CM | POA: Diagnosis not present

## 2017-09-03 DIAGNOSIS — Z0000 Encounter for general adult medical examination without abnormal findings: Secondary | ICD-10-CM | POA: Diagnosis not present

## 2017-09-03 DIAGNOSIS — I480 Paroxysmal atrial fibrillation: Secondary | ICD-10-CM | POA: Diagnosis not present

## 2017-09-25 DIAGNOSIS — E559 Vitamin D deficiency, unspecified: Secondary | ICD-10-CM | POA: Diagnosis not present

## 2017-09-25 DIAGNOSIS — E673 Hypervitaminosis D: Secondary | ICD-10-CM | POA: Diagnosis not present

## 2017-09-25 DIAGNOSIS — I10 Essential (primary) hypertension: Secondary | ICD-10-CM | POA: Diagnosis not present

## 2017-10-30 DIAGNOSIS — H401133 Primary open-angle glaucoma, bilateral, severe stage: Secondary | ICD-10-CM | POA: Diagnosis not present

## 2018-02-26 DIAGNOSIS — H401133 Primary open-angle glaucoma, bilateral, severe stage: Secondary | ICD-10-CM | POA: Diagnosis not present

## 2018-03-26 DIAGNOSIS — E559 Vitamin D deficiency, unspecified: Secondary | ICD-10-CM | POA: Diagnosis not present

## 2018-03-26 DIAGNOSIS — R7989 Other specified abnormal findings of blood chemistry: Secondary | ICD-10-CM | POA: Diagnosis not present

## 2018-03-26 DIAGNOSIS — I10 Essential (primary) hypertension: Secondary | ICD-10-CM | POA: Diagnosis not present

## 2018-04-01 DIAGNOSIS — I4891 Unspecified atrial fibrillation: Secondary | ICD-10-CM | POA: Diagnosis not present

## 2018-04-01 DIAGNOSIS — Z0000 Encounter for general adult medical examination without abnormal findings: Secondary | ICD-10-CM | POA: Diagnosis not present

## 2018-04-01 DIAGNOSIS — Z23 Encounter for immunization: Secondary | ICD-10-CM | POA: Diagnosis not present

## 2018-04-01 DIAGNOSIS — E039 Hypothyroidism, unspecified: Secondary | ICD-10-CM | POA: Diagnosis not present

## 2018-04-01 DIAGNOSIS — E559 Vitamin D deficiency, unspecified: Secondary | ICD-10-CM | POA: Diagnosis not present

## 2018-04-01 DIAGNOSIS — I10 Essential (primary) hypertension: Secondary | ICD-10-CM | POA: Diagnosis not present

## 2018-04-01 DIAGNOSIS — M8000XG Age-related osteoporosis with current pathological fracture, unspecified site, subsequent encounter for fracture with delayed healing: Secondary | ICD-10-CM | POA: Diagnosis not present

## 2018-04-01 DIAGNOSIS — M40202 Unspecified kyphosis, cervical region: Secondary | ICD-10-CM | POA: Diagnosis not present

## 2018-06-27 DIAGNOSIS — H401133 Primary open-angle glaucoma, bilateral, severe stage: Secondary | ICD-10-CM | POA: Diagnosis not present

## 2018-10-22 DIAGNOSIS — F418 Other specified anxiety disorders: Secondary | ICD-10-CM | POA: Diagnosis not present

## 2018-10-30 DIAGNOSIS — F418 Other specified anxiety disorders: Secondary | ICD-10-CM | POA: Diagnosis not present

## 2019-02-20 ENCOUNTER — Other Ambulatory Visit: Payer: Self-pay

## 2019-03-20 DIAGNOSIS — R531 Weakness: Secondary | ICD-10-CM | POA: Diagnosis not present

## 2019-03-20 DIAGNOSIS — R634 Abnormal weight loss: Secondary | ICD-10-CM | POA: Diagnosis not present

## 2019-03-20 DIAGNOSIS — I509 Heart failure, unspecified: Secondary | ICD-10-CM | POA: Diagnosis not present

## 2019-03-20 DIAGNOSIS — I4891 Unspecified atrial fibrillation: Secondary | ICD-10-CM | POA: Diagnosis not present

## 2019-03-20 DIAGNOSIS — F0390 Unspecified dementia without behavioral disturbance: Secondary | ICD-10-CM | POA: Diagnosis not present

## 2019-03-20 DIAGNOSIS — I10 Essential (primary) hypertension: Secondary | ICD-10-CM | POA: Diagnosis not present

## 2019-03-24 DIAGNOSIS — R2681 Unsteadiness on feet: Secondary | ICD-10-CM | POA: Diagnosis not present

## 2019-03-24 DIAGNOSIS — F0390 Unspecified dementia without behavioral disturbance: Secondary | ICD-10-CM | POA: Diagnosis not present

## 2019-03-24 DIAGNOSIS — E860 Dehydration: Secondary | ICD-10-CM | POA: Diagnosis not present

## 2019-04-04 DIAGNOSIS — E46 Unspecified protein-calorie malnutrition: Secondary | ICD-10-CM | POA: Diagnosis not present

## 2019-04-04 DIAGNOSIS — E785 Hyperlipidemia, unspecified: Secondary | ICD-10-CM | POA: Diagnosis not present

## 2019-04-04 DIAGNOSIS — Z7401 Bed confinement status: Secondary | ICD-10-CM | POA: Diagnosis not present

## 2019-04-04 DIAGNOSIS — H409 Unspecified glaucoma: Secondary | ICD-10-CM | POA: Diagnosis not present

## 2019-04-04 DIAGNOSIS — F329 Major depressive disorder, single episode, unspecified: Secondary | ICD-10-CM | POA: Diagnosis not present

## 2019-04-04 DIAGNOSIS — I429 Cardiomyopathy, unspecified: Secondary | ICD-10-CM | POA: Diagnosis not present

## 2019-04-04 DIAGNOSIS — Z681 Body mass index (BMI) 19 or less, adult: Secondary | ICD-10-CM | POA: Diagnosis not present

## 2019-04-04 DIAGNOSIS — F0390 Unspecified dementia without behavioral disturbance: Secondary | ICD-10-CM | POA: Diagnosis not present

## 2019-04-04 DIAGNOSIS — Z741 Need for assistance with personal care: Secondary | ICD-10-CM | POA: Diagnosis not present

## 2019-04-04 DIAGNOSIS — I509 Heart failure, unspecified: Secondary | ICD-10-CM | POA: Diagnosis not present

## 2019-04-04 DIAGNOSIS — I110 Hypertensive heart disease with heart failure: Secondary | ICD-10-CM | POA: Diagnosis not present

## 2019-04-04 DIAGNOSIS — I4891 Unspecified atrial fibrillation: Secondary | ICD-10-CM | POA: Diagnosis not present

## 2019-04-05 DIAGNOSIS — I429 Cardiomyopathy, unspecified: Secondary | ICD-10-CM | POA: Diagnosis not present

## 2019-04-05 DIAGNOSIS — I509 Heart failure, unspecified: Secondary | ICD-10-CM | POA: Diagnosis not present

## 2019-04-05 DIAGNOSIS — E785 Hyperlipidemia, unspecified: Secondary | ICD-10-CM | POA: Diagnosis not present

## 2019-04-05 DIAGNOSIS — I110 Hypertensive heart disease with heart failure: Secondary | ICD-10-CM | POA: Diagnosis not present

## 2019-04-05 DIAGNOSIS — F329 Major depressive disorder, single episode, unspecified: Secondary | ICD-10-CM | POA: Diagnosis not present

## 2019-04-05 DIAGNOSIS — I4891 Unspecified atrial fibrillation: Secondary | ICD-10-CM | POA: Diagnosis not present

## 2019-04-06 DIAGNOSIS — I429 Cardiomyopathy, unspecified: Secondary | ICD-10-CM | POA: Diagnosis not present

## 2019-04-06 DIAGNOSIS — F329 Major depressive disorder, single episode, unspecified: Secondary | ICD-10-CM | POA: Diagnosis not present

## 2019-04-06 DIAGNOSIS — I509 Heart failure, unspecified: Secondary | ICD-10-CM | POA: Diagnosis not present

## 2019-04-06 DIAGNOSIS — E785 Hyperlipidemia, unspecified: Secondary | ICD-10-CM | POA: Diagnosis not present

## 2019-04-06 DIAGNOSIS — I4891 Unspecified atrial fibrillation: Secondary | ICD-10-CM | POA: Diagnosis not present

## 2019-04-06 DIAGNOSIS — I110 Hypertensive heart disease with heart failure: Secondary | ICD-10-CM | POA: Diagnosis not present

## 2019-04-07 DIAGNOSIS — I429 Cardiomyopathy, unspecified: Secondary | ICD-10-CM | POA: Diagnosis not present

## 2019-04-07 DIAGNOSIS — F329 Major depressive disorder, single episode, unspecified: Secondary | ICD-10-CM | POA: Diagnosis not present

## 2019-04-07 DIAGNOSIS — I110 Hypertensive heart disease with heart failure: Secondary | ICD-10-CM | POA: Diagnosis not present

## 2019-04-07 DIAGNOSIS — I509 Heart failure, unspecified: Secondary | ICD-10-CM | POA: Diagnosis not present

## 2019-04-07 DIAGNOSIS — I4891 Unspecified atrial fibrillation: Secondary | ICD-10-CM | POA: Diagnosis not present

## 2019-04-07 DIAGNOSIS — E785 Hyperlipidemia, unspecified: Secondary | ICD-10-CM | POA: Diagnosis not present

## 2019-04-24 DEATH — deceased
